# Patient Record
Sex: Female | Born: 1960 | Race: Black or African American | Hispanic: No | Marital: Single | Smoking: Former smoker
Health system: Southern US, Community
[De-identification: ages and names within clinical notes are randomized; demographics above are authoritative.]

## PROBLEM LIST (undated history)

## (undated) DIAGNOSIS — T7840XA Allergy, unspecified, initial encounter: Secondary | ICD-10-CM

## (undated) DIAGNOSIS — E119 Type 2 diabetes mellitus without complications: Secondary | ICD-10-CM

## (undated) DIAGNOSIS — M1990 Unspecified osteoarthritis, unspecified site: Secondary | ICD-10-CM

## (undated) DIAGNOSIS — I10 Essential (primary) hypertension: Secondary | ICD-10-CM

## (undated) HISTORY — DX: Allergy, unspecified, initial encounter: T78.40XA

## (undated) HISTORY — DX: Unspecified osteoarthritis, unspecified site: M19.90

## (undated) HISTORY — DX: Essential (primary) hypertension: I10

## (undated) HISTORY — DX: Type 2 diabetes mellitus without complications: E11.9

---

## 2020-06-26 HISTORY — PX: ROTATOR CUFF REPAIR: SHX139

## 2021-03-20 ENCOUNTER — Encounter: Payer: Self-pay | Admitting: Registered Nurse

## 2021-03-20 ENCOUNTER — Other Ambulatory Visit: Payer: Self-pay

## 2021-03-20 ENCOUNTER — Ambulatory Visit (INDEPENDENT_AMBULATORY_CARE_PROVIDER_SITE_OTHER): Payer: 59 | Admitting: Registered Nurse

## 2021-03-20 VITALS — BP 120/64 | HR 100 | Temp 97.3°F | Ht 65.0 in | Wt 156.8 lb

## 2021-03-20 DIAGNOSIS — I10 Essential (primary) hypertension: Secondary | ICD-10-CM

## 2021-03-20 DIAGNOSIS — Z1329 Encounter for screening for other suspected endocrine disorder: Secondary | ICD-10-CM | POA: Diagnosis not present

## 2021-03-20 DIAGNOSIS — Z13228 Encounter for screening for other metabolic disorders: Secondary | ICD-10-CM

## 2021-03-20 DIAGNOSIS — E119 Type 2 diabetes mellitus without complications: Secondary | ICD-10-CM | POA: Diagnosis not present

## 2021-03-20 DIAGNOSIS — Z130 Encounter for screening for diseases of the blood and blood-forming organs and certain disorders involving the immune mechanism: Secondary | ICD-10-CM

## 2021-03-20 DIAGNOSIS — J301 Allergic rhinitis due to pollen: Secondary | ICD-10-CM

## 2021-03-20 DIAGNOSIS — Z9889 Other specified postprocedural states: Secondary | ICD-10-CM

## 2021-03-20 DIAGNOSIS — K219 Gastro-esophageal reflux disease without esophagitis: Secondary | ICD-10-CM

## 2021-03-20 DIAGNOSIS — I498 Other specified cardiac arrhythmias: Secondary | ICD-10-CM

## 2021-03-20 MED ORDER — PANTOPRAZOLE SODIUM 40 MG PO TBEC: 40.0000 mg | DELAYED_RELEASE_TABLET | Freq: Every day | ORAL | 3 refills | Status: DC

## 2021-03-20 MED ORDER — AZELASTINE HCL 0.1 % NA SOLN: 1.0000 | Freq: Two times a day (BID) | NASAL | 12 refills | Status: DC

## 2021-03-20 MED ORDER — CETIRIZINE HCL 10 MG PO TABS: 10.0000 mg | ORAL_TABLET | Freq: Every day | ORAL | 11 refills | Status: DC

## 2021-03-20 NOTE — Progress Notes (Signed)
New Patient Office Visit  Subjective:  Patient ID: Charlotte Dickerson, female    DOB: 05-Apr-1961  Age: 60 y.o. MRN: [id number]  CC:  Chief Complaint  Patient presents with  . Other    New Patient    HPI Marianne Golightly presents for visit to est care  Moved to [place] from [place], [place] recently. Adjusting ok, likes area.   Concerns today: T2DM: Dx some time ago. No complications. Reports generally good control of a1c but over past few weeks sugars have climbed over 200 more frequently. No visual, neuro, or urinary symptoms. Wants to get all lab work checked today.  Allergies: Ongoing since moving to [place]. Has tried flonase with some effect. Open to further options Rhinitis and sinusitis. No wheezing or breathing difficulties. Some conjunctivitis but managed well with OTC drops  Fluttering: In chest. On and off. Not associated with exertion, anxiety, or pain. No chest pain. Not associated with reflux or any neuro symptoms. Has been on and off for some time. Had EKG at past PCP office in [place] which she reports was wnl. Has not seen cardiologist.  Shoulder pain: r shoulder, s/p rotator cuff repair in 2019. Was due for MRI w contrast in [place] but could not get this approved by insurance in time before moving. Would like to establish with local ortho.  Epigastric pain: Ongoing. Worse with first meal, resolves after. Occasional bubbling sensation. Burning pain. Not associated with fluttering. No bowel movement changes. Denies symptoms of anemia.   Histories reviewed and updated with patient.  Past Medical History:  Diagnosis Date  . Allergies   . Arthritis   . Diabetes (HCC)   . HTN (hypertension)     Past Surgical History:  Procedure Laterality Date  . ROTATOR CUFF REPAIR  06/2020    Family History  Problem Relation Age of Onset  . Diabetes Mother   . Heart attack Mother   . Hypertension Mother   . Stroke Mother   . Depression Sister     Social History    Socioeconomic History  . Marital status: Single    Spouse name: Not on file  . Number of children: Not on file  . Years of education: Not on file  . Highest education level: Not on file  Occupational History  . Not on file  Tobacco Use  . Smoking status: Former Smoker    Types: Cigarettes  . Smokeless tobacco: Never Used  Substance and Sexual Activity  . Alcohol use: Not Currently    Alcohol/week: 3.0 - 4.0 standard drinks    Types: 3 - 4 Glasses of wine per week  . Drug use: Never  . Sexual activity: Not Currently  Other Topics Concern  . Not on file  Social History Narrative  . Not on file   Social Determinants of Health   Financial Resource Strain: Not on file  Food Insecurity: Not on file  Transportation Needs: Not on file  Physical Activity: Not on file  Stress: Not on file  Social Connections: Not on file  Intimate Partner Violence: Not on file    ROS Review of Systems  Constitutional: Negative.   HENT: Positive for sinus pressure and sneezing.   Eyes: Positive for itching.  Respiratory: Negative.   Cardiovascular: Positive for palpitations. Negative for chest pain and leg swelling.  Gastrointestinal: Negative.   Genitourinary: Negative.   Musculoskeletal: Positive for arthralgias (shoulder pain).  Skin: Negative.   Neurological: Negative.   Psychiatric/Behavioral: Negative.   All  other systems reviewed and are negative.   Objective:   Today's Vitals: BP 120/64   Pulse 100   Temp (!) 97.3 F (36.3 C)   Ht 5\' 5"  (1.651 m)   Wt 156 lb 12.8 oz (71.1 kg)   SpO2 98%   BMI 26.09 kg/m   Physical Exam Vitals and nursing note reviewed.  Constitutional:      General: She is not in acute distress.    Appearance: Normal appearance. She is not ill-appearing, toxic-appearing or diaphoretic.  HENT:     Nose: Congestion and rhinorrhea present.     Mouth/Throat:     Mouth: Mucous membranes are moist.     Pharynx: Oropharynx is clear.  Cardiovascular:      Rate and Rhythm: Normal rate and regular rhythm.     Pulses: Normal pulses.     Heart sounds: Normal heart sounds. No murmur heard. No friction rub. No gallop.   Pulmonary:     Effort: Pulmonary effort is normal. No respiratory distress.     Breath sounds: Normal breath sounds. No stridor. No wheezing, rhonchi or rales.  Chest:     Chest wall: No tenderness.  Skin:    General: Skin is warm and dry.     Capillary Refill: Capillary refill takes less than 2 seconds.  Neurological:     General: No focal deficit present.     Mental Status: She is alert and oriented to person, place, and time. Mental status is at baseline.  Psychiatric:        Mood and Affect: Mood normal.        Behavior: Behavior normal.        Thought Content: Thought content normal.        Judgment: Judgment normal.     Assessment & Plan:   Problem List Items Addressed This Visit   None   Visit Diagnoses    Seasonal allergic rhinitis due to pollen    -  Primary   Relevant Medications   azelastine (ASTELIN) 0.1 % nasal spray   cetirizine (ZYRTEC) 10 MG tablet   Type 2 diabetes mellitus without complication, without long-term current use of insulin (HCC)       Relevant Medications   glipiZIDE (GLUCOTROL) 10 MG tablet   atorvastatin (LIPITOR) 20 MG tablet   metFORMIN (GLUCOPHAGE) 1000 MG tablet   Other Relevant Orders   CBC with Differential/Platelet   Comprehensive metabolic panel   Hemoglobin A1c   Lipid panel   TSH   Essential hypertension       Relevant Medications   atorvastatin (LIPITOR) 20 MG tablet   amLODipine (NORVASC) 10 MG tablet   Other Relevant Orders   CBC with Differential/Platelet   Comprehensive metabolic panel   Hemoglobin A1c   Lipid panel   TSH   Screening for endocrine, metabolic and immunity disorder       Relevant Orders   CBC with Differential/Platelet   Comprehensive metabolic panel   Hemoglobin A1c   Lipid panel   TSH      Outpatient Encounter Medications as  of 03/20/2021  Medication Sig  . amLODipine (NORVASC) 10 MG tablet Take 10 mg by mouth daily.  03/22/2021 azelastine (ASTELIN) 0.1 % nasal spray Place 1 spray into both nostrils 2 (two) times daily. Use in each nostril as directed  . cetirizine (ZYRTEC) 10 MG tablet Take 1 tablet (10 mg total) by mouth daily.  Marland Kitchen glipiZIDE (GLUCOTROL) 10 MG tablet Take 10 mg by mouth daily  before breakfast.  . metFORMIN (GLUCOPHAGE) 1000 MG tablet Take 1,000 mg by mouth 2 (two) times daily with a meal.  . atorvastatin (LIPITOR) 20 MG tablet Take 1 tablet by mouth at bedtime.   No facility-administered encounter medications on file as of 03/20/2021.    Follow-up: No follow-ups on file.   PLAN  Labs collected. Will follow up with the patient as warranted.  Return in 3 mo for med check and chronic conditions  Refill medications x 3 mo today  No concerns on limited exam  Try azelastine nasal spray and cetirizine for allergies.  Refer to cardiology  Refer to ortho  Pantoprazole for suspected GERD.   Patient encouraged to call clinic with any questions, comments, or concerns.  Janeece Agee, NP

## 2021-03-21 LAB — LIPID PANEL
Cholesterol: 166 mg/dL (ref 0–200)
HDL: 36.5 mg/dL — ABNORMAL LOW (ref >39.00)
NonHDL: 129.35
Total CHOL/HDL Ratio: 5
Triglycerides: 204 mg/dL — ABNORMAL HIGH (ref 0.0–149.0)
VLDL: 40.8 mg/dL — ABNORMAL HIGH (ref 0.0–40.0)

## 2021-03-21 LAB — COMPREHENSIVE METABOLIC PANEL
ALT: 25 U/L (ref 0–35)
AST: 22 U/L (ref 0–37)
Albumin: 4.7 g/dL (ref 3.5–5.2)
Alkaline Phosphatase: 101 U/L (ref 39–117)
BUN: 16 mg/dL (ref 6–23)
CO2: 28 mEq/L (ref 19–32)
Calcium: 10.8 mg/dL — ABNORMAL HIGH (ref 8.4–10.5)
Chloride: 103 mEq/L (ref 96–112)
Creatinine, Ser: 0.83 mg/dL (ref 0.40–1.20)
GFR: 77.56 mL/min (ref >60.00)
Glucose, Bld: 150 mg/dL — ABNORMAL HIGH (ref 70–99)
Potassium: 4.6 mEq/L (ref 3.5–5.1)
Sodium: 140 mEq/L (ref 135–145)
Total Bilirubin: 0.7 mg/dL (ref 0.2–1.2)
Total Protein: 7.8 g/dL (ref 6.0–8.3)

## 2021-03-21 LAB — CBC WITH DIFFERENTIAL/PLATELET
Basophils Absolute: 0.1 10*3/uL (ref 0.0–0.1)
Basophils Relative: 1.2 % (ref 0.0–3.0)
Eosinophils Absolute: 0.1 10*3/uL (ref 0.0–0.7)
Eosinophils Relative: 1.5 % (ref 0.0–5.0)
HCT: 40 % (ref 36.0–46.0)
Hemoglobin: 13.4 g/dL (ref 12.0–15.0)
Lymphocytes Relative: 38.6 % (ref 12.0–46.0)
Lymphs Abs: 3.2 10*3/uL (ref 0.7–4.0)
MCHC: 33.4 g/dL (ref 30.0–36.0)
MCV: 78.2 fl (ref 78.0–100.0)
Monocytes Absolute: 0.4 10*3/uL (ref 0.1–1.0)
Monocytes Relative: 5.1 % (ref 3.0–12.0)
Neutro Abs: 4.4 10*3/uL (ref 1.4–7.7)
Neutrophils Relative %: 53.6 % (ref 43.0–77.0)
Platelets: 334 10*3/uL (ref 150.0–400.0)
RBC: 5.11 Mil/uL (ref 3.87–5.11)
RDW: 14.5 % (ref 11.5–15.5)
WBC: 8.2 10*3/uL (ref 4.0–10.5)

## 2021-03-21 LAB — LDL CHOLESTEROL, DIRECT: Direct LDL: 109 mg/dL

## 2021-03-21 LAB — HEMOGLOBIN A1C: Hgb A1c MFr Bld: 9.1 % — ABNORMAL HIGH (ref 4.6–6.5)

## 2021-03-21 LAB — TSH: TSH: 0.78 u[IU]/mL (ref 0.35–4.50)

## 2021-03-22 ENCOUNTER — Encounter: Payer: Self-pay | Admitting: Registered Nurse

## 2021-03-27 ENCOUNTER — Telehealth: Payer: Self-pay

## 2021-03-27 NOTE — Telephone Encounter (Signed)
Patient is calling in wondering if someone is able to go over her test results with her.

## 2021-03-31 ENCOUNTER — Encounter: Payer: Self-pay | Admitting: Orthopaedic Surgery

## 2021-03-31 ENCOUNTER — Ambulatory Visit (INDEPENDENT_AMBULATORY_CARE_PROVIDER_SITE_OTHER): Payer: 59 | Admitting: Orthopaedic Surgery

## 2021-03-31 ENCOUNTER — Ambulatory Visit (INDEPENDENT_AMBULATORY_CARE_PROVIDER_SITE_OTHER): Payer: 59

## 2021-03-31 VITALS — Ht 65.0 in | Wt 158.0 lb

## 2021-03-31 DIAGNOSIS — G8929 Other chronic pain: Secondary | ICD-10-CM | POA: Diagnosis not present

## 2021-03-31 DIAGNOSIS — M25511 Pain in right shoulder: Secondary | ICD-10-CM

## 2021-03-31 NOTE — Progress Notes (Signed)
Office Visit Note   Patient: Charlotte Dickerson           Date of Birth: 08/13/1961           MRN: [id number] Visit Date: 03/31/2021              Requested by: Janeece Agee, NP [address] [place],  [place] [address] PCP: Janeece Agee, NP   Assessment & Plan: Visit Diagnoses:  1. Chronic right shoulder pain     Plan: My impression is postsurgical adhesive capsulitis.  Her most recent A1c was also 9.1.  She understands the importance of how poorly controlled diabetes affects the outcome and function of her shoulder.  I have made a referral to outpatient physical therapy as well as a referral to Dr. Prince Rome for a glenohumeral injection.  I would like to recheck her in about 8 weeks.  Follow-Up Instructions: Return in about 8 weeks (around 05/26/2021).   Orders:  Orders Placed This Encounter  Procedures  . XR Shoulder Right  . Ambulatory referral to Physical Therapy   No orders of the defined types were placed in this encounter.     Procedures: No procedures performed   Clinical Data: No additional findings.   Subjective: Chief Complaint  Patient presents with  . Right Shoulder - Pain    Lilac is a 60 year old female who recently relocated to [place] from [place].  She works as a Water engineer.  She is right-hand dominant.  She had a rotator cuff repair in August of last year.  She states that she continues to have pain and decreased range of motion.  She is a poorly controlled diabetic.  She completed physical therapy down in [place] but has been unable to resume this since she has moved to [place].  Currently not taking any pain medications.  She was also supposed to get another MRI due to continued pain but because of the move she never underwent the study.  She has trouble reaching back as well as above her head.   Review of Systems  Constitutional: Negative.   HENT: Negative.   Eyes: Negative.   Respiratory: Negative.   Cardiovascular: Negative.    Endocrine: Negative.   Musculoskeletal: Negative.   Neurological: Negative.   Hematological: Negative.   Psychiatric/Behavioral: Negative.   All other systems reviewed and are negative.    Objective: Vital Signs: Ht 5\' 5"  (1.651 m)   Wt 158 lb (71.7 kg)   BMI 26.29 kg/m   Physical Exam Vitals and nursing note reviewed.  Constitutional:      Appearance: She is well-developed.  HENT:     Head: Normocephalic and atraumatic.  Pulmonary:     Effort: Pulmonary effort is normal.  Abdominal:     Palpations: Abdomen is soft.  Musculoskeletal:     Cervical back: Neck supple.  Skin:    General: Skin is warm.     Capillary Refill: Capillary refill takes less than 2 seconds.  Neurological:     Mental Status: She is alert and oriented to person, place, and time.  Psychiatric:        Behavior: Behavior normal.        Thought Content: Thought content normal.        Judgment: Judgment normal.     Ortho Exam Right shoulder shows fully healed surgical scars.  Contours are symmetric to the contralateral side.  Range of motion is consistent with moderate adhesive capsulitis.  Strength is slightly decreased  secondary to pain. Specialty Comments:  No specialty comments available.  Imaging: XR Shoulder Right  Result Date: 03/31/2021 Arthritic changes to the [facility] joint.  Circular lucencies within the humeral head likely from recent rotator cuff anchor placement    PMFS History: There are no problems to display for this patient.  Past Medical History:  Diagnosis Date  . Allergies   . Arthritis   . Diabetes (HCC)   . HTN (hypertension)     Family History  Problem Relation Age of Onset  . Diabetes Mother   . Heart attack Mother   . Hypertension Mother   . Stroke Mother   . Depression Sister     Past Surgical History:  Procedure Laterality Date  . ROTATOR CUFF REPAIR  06/2020   Social History   Occupational History  . Not on file  Tobacco Use  . Smoking status: Former  Smoker    Types: Cigarettes  . Smokeless tobacco: Never Used  Substance and Sexual Activity  . Alcohol use: Not Currently    Alcohol/week: 3.0 - 4.0 standard drinks    Types: 3 - 4 Glasses of wine per week  . Drug use: Never  . Sexual activity: Not Currently

## 2021-04-03 ENCOUNTER — Telehealth: Payer: Self-pay | Admitting: Registered Nurse

## 2021-04-03 NOTE — Telephone Encounter (Signed)
..  Medication Refills  Last OV:  Medication:  Patient needs all 4 of her medications Pharmacy:  [facility] [place]  Let patient know to contact pharmacy at the end of the day to make sure medication is ready.   Please notify patient to allow 48-72 hours to process.  Encourage patient to contact the pharmacy for refills or they can request refills through [facility]  Clinical Fills out below:   Last refill:  QTY:  Refill Date:    Other Comments:   Okay for refill?  Please advise.

## 2021-04-06 ENCOUNTER — Telehealth: Payer: Self-pay | Admitting: Orthopaedic Surgery

## 2021-04-06 NOTE — Telephone Encounter (Signed)
Referral to Our PT upstairs  Number was given

## 2021-04-06 NOTE — Telephone Encounter (Signed)
Pt called asking for the number of the facility her PT referral was sent to; please.   [phone] ok to LVM if she doesn't answer.

## 2021-04-06 NOTE — Telephone Encounter (Signed)
Patient called back very unhappy that she has not had a response on this message - Please advise

## 2021-04-07 ENCOUNTER — Other Ambulatory Visit: Payer: Self-pay

## 2021-04-07 ENCOUNTER — Other Ambulatory Visit: Payer: Self-pay | Admitting: Registered Nurse

## 2021-04-07 ENCOUNTER — Ambulatory Visit: Payer: 59 | Admitting: Family Medicine

## 2021-04-07 DIAGNOSIS — I10 Essential (primary) hypertension: Secondary | ICD-10-CM

## 2021-04-07 DIAGNOSIS — E119 Type 2 diabetes mellitus without complications: Secondary | ICD-10-CM

## 2021-04-07 MED ORDER — AMLODIPINE BESYLATE 10 MG PO TABS: 10.0000 mg | ORAL_TABLET | Freq: Every day | ORAL | 1 refills | Status: DC

## 2021-04-07 MED ORDER — GLIPIZIDE 10 MG PO TABS: 10.0000 mg | ORAL_TABLET | Freq: Every day | ORAL | 1 refills | Status: DC

## 2021-04-07 MED ORDER — METFORMIN HCL 1000 MG PO TABS: 1000.0000 mg | ORAL_TABLET | Freq: Two times a day (BID) | ORAL | 1 refills | Status: DC

## 2021-04-07 MED ORDER — ATORVASTATIN CALCIUM 20 MG PO TABS: 1.0000 | ORAL_TABLET | Freq: Every day | ORAL | 1 refills | Status: DC

## 2021-04-07 MED ORDER — TRUE METRIX PRO BLOOD GLUCOSE VI STRP
ORAL_STRIP | 12 refills | Status: DC
Start: 2021-04-07 — End: 2021-04-07

## 2021-04-07 MED ORDER — TRUE METRIX PRO BLOOD GLUCOSE VI STRP: ORAL_STRIP | 12 refills | Status: DC

## 2021-04-07 NOTE — Telephone Encounter (Signed)
Test strips sent to pharmacy.

## 2021-04-07 NOTE — Telephone Encounter (Signed)
Sent ? ?Thanks, ? ?Rich

## 2021-04-07 NOTE — Telephone Encounter (Signed)
Letter has been sent. 3 month follow up suggested given A1c of 9.1. Otherwise no acute concerns.   Thank you  Rich

## 2021-04-07 NOTE — Telephone Encounter (Signed)
Called patient with pcp information. Patient states she never received a letter. Address verified. Letter was read to patient. She voiced understanding. Also informed her that her prescriptions were sent to her pharmacy.

## 2021-04-21 ENCOUNTER — Other Ambulatory Visit: Payer: Self-pay

## 2021-04-21 ENCOUNTER — Ambulatory Visit: Payer: 59 | Attending: Orthopaedic Surgery

## 2021-04-21 DIAGNOSIS — G8929 Other chronic pain: Secondary | ICD-10-CM | POA: Diagnosis present

## 2021-04-21 DIAGNOSIS — M25511 Pain in right shoulder: Secondary | ICD-10-CM | POA: Diagnosis present

## 2021-04-21 DIAGNOSIS — R293 Abnormal posture: Secondary | ICD-10-CM | POA: Diagnosis present

## 2021-04-21 DIAGNOSIS — M25611 Stiffness of right shoulder, not elsewhere classified: Secondary | ICD-10-CM | POA: Insufficient documentation

## 2021-04-21 DIAGNOSIS — M6281 Muscle weakness (generalized): Secondary | ICD-10-CM | POA: Insufficient documentation

## 2021-04-21 NOTE — Therapy (Signed)
[facility] [address] [place], [place], [address] Phone: [phone]   Fax:  [phone]  Physical Therapy Evaluation  Patient Details  Name: Charlotte Dickerson MRN: [id number] Date of Birth: August 16, 1961 Referring Provider (PT): Naiping M. Roda Shutters, MD   Encounter Date: 04/21/2021   PT End of Session - 04/21/21 1407    Visit Number 1    Number of Visits 16    Date for PT Re-Evaluation 06/30/21    Authorization Type [employer]    PT Start Time 1104    PT Stop Time 1155    PT Time Calculation (min) 51 min    Activity Tolerance Patient tolerated treatment well;Patient limited by pain    Behavior During Therapy [facility] for tasks assessed/performed           Past Medical History:  Diagnosis Date  . Allergies   . Arthritis   . Diabetes (HCC)   . HTN (hypertension)     Past Surgical History:  Procedure Laterality Date  . ROTATOR CUFF REPAIR  06/2020    There were no vitals filed for this visit.    Subjective Assessment - 04/21/21 1112    Subjective Pt reports she had a R RCR in [place]. She had ~30 PT session in [place]. She feels pain constantly, but is most restrictive in motion, feeling pulling in the back of her shoulder and scapula with some numbness in R UE. She is scared of a cortisone injection and of anything bad happening. Her shoulder started freezing in Jan/Feb.    Limitations House hold activities;Lifting    Diagnostic tests R shoulder XR: Arthritic changes to the [employer] joint.  Circular lucencies within the humeral   head likely from recent rotator cuff anchor placement    Patient Stated Goals I want to be able to use my arm and stretch it out    Currently in Pain? Yes    Pain Score 5    Worst 7-8/10   Pain Location Shoulder    Pain Orientation Right;Anterior;Posterior    Pain Descriptors / Personal assistant;Aching;Tightness    Pain Radiating Towards down the arm with some numbness    Pain Onset More than  a month ago    Pain Frequency Intermittent    Aggravating Factors  moving it, sleeping    Pain Relieving Factors Intermittently ice/heat, stretching    Effect of Pain on Daily Activities can't wash back, difficulty reaching behind head to wash hair              [facility] PT Assessment - 04/21/21 0001      Assessment   Referring Provider (PT) Naiping M. Roda Shutters, MD    Onset Date/Surgical Date 07/18/20    Hand Dominance Right    Next MD Visit 3-6 weeks    Prior Therapy Yes for shoulder following surgery      Precautions   Precautions None      Restrictions   Weight Bearing Restrictions No      Balance Screen   Has the patient fallen in the past 6 months No    Has the patient had a decrease in activity level because of a fear of falling?  Yes    Is the patient reluctant to leave their home because of a fear of falling?  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, trying to open a food truck    Leisure Cooking, church, resting      Observation/Other Assessments   Focus on Therapeutic Outcomes (FOTO)  51% ability, 66% predicted      Posture/Postural Control   Posture/Postural Control Postural limitations    Posture Comments Mild forward rounded shoulders      ROM / Strength   AROM / PROM / Strength AROM;Strength      AROM   Overall AROM Comments pain in R    AROM Assessment Site Shoulder    Right/Left Shoulder Right;Left    Right Shoulder Flexion 95 Degrees    Right Shoulder ABduction 80 Degrees    Right Shoulder Internal Rotation --   functional to glute   Right Shoulder External Rotation --   functional to occiput   Left Shoulder Flexion 145 Degrees    Left  Shoulder ABduction 158 Degrees    Left Shoulder Internal Rotation --   functional to T7   Left Shoulder External Rotation --   functional to T3     Strength   Overall Strength Comments pain in R shoulder    Strength Assessment Site Shoulder    Right/Left Shoulder Right;Left    Right Shoulder Flexion 4-/5    Right Shoulder ABduction 4-/5    Right Shoulder Internal Rotation 4/5    Right Shoulder External Rotation 4-/5    Left Shoulder Flexion 5/5    Left Shoulder ABduction 5/5    Left Shoulder Internal Rotation 5/5    Left Shoulder External Rotation 4+/5      Palpation   Palpation comment increased soft tissue tension to R UT, LS, pecs; TTP anterior ACJ and periscapular                      Objective measurements completed on examination: See above findings.               PT Education - 04/21/21 1409    Education Details educated on FOTO, diagnosis, prognosis, HEP, and POC    Person(s) Educated Patient    Methods Explanation;Demonstration;Tactile cues;Verbal cues;Handout    Comprehension Verbalized understanding;Returned demonstration;Verbal cues required;Tactile cues required            PT Short Term Goals - 04/21/21 1418      PT SHORT TERM GOAL #1   Title Pt will be I and compliant with initial HEP.    Baseline provided at eval    Time 3    Period Weeks    Status New    Target Date 05/12/21      PT SHORT TERM GOAL #2   Title Pt will demonstrate functional IR to at least L1 and ER to at least C7, in order to perform hygiene on low back and hair in the shower.    Baseline glute and occiput    Time 4    Period Weeks    Status New    Target Date 05/19/21             PT Long Term Goals - 04/21/21 1419      PT LONG TERM GOAL #1   Title Pt will demonstrate functional IR to at least T8 and ER  to at least T1, in order to perform hygiene, grooming, don/doff bra and other clothing.    Baseline currently ER to glute and IR to occiput    Time 8     Period Weeks    Status New    Target Date 06/16/21      PT LONG TERM GOAL #2   Title Pt will increase flexion and abduction ROM to 145 or greater for symmetry with L UE    Baseline see flowsheet    Time 8    Period Weeks    Status New    Target Date 06/16/21      PT LONG TERM GOAL #3   Title Pt will be able to sleep through the night without waking up secondary to R shoulder pain.    Time 8    Period Weeks    Status New    Target Date 06/16/21      PT LONG TERM GOAL #4   Title Pt will decrease R shoulder pain to </= 3/10 with all functional activity, including I/ADLs and work.    Baseline 8/10 at worst    Time 8    Period Weeks    Status New    Target Date 06/16/21      PT LONG TERM GOAL #5   Title Pt will increase FOTO ability to at least 66% ability, in order to demonstrate meaningful change in perceived level of functional ability.    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 Pt did have physical therapy for ~30 session after surgery when she was living in [place]. She moved to [place] earlier this year and experienced onset of frozen shoulder soonafter. Pt reports her referring provider suggested cortisone injection, which she was scared of but will consider at her follow-up visit. She demonstrates impairments in R shoulder ROM, MMT, posture and positioning of R shoulder, overuse of R UT/LS, dysfunctional scapulohumeral rhythm, and pain with all motion. 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 She was educated on FOTO, diagnosis, prognosis, HEP, and POC. Pt verbalized understanding and consent to tx and would benefit from skilled PT 2x/week for 4 weeks then 1-2x/week for 4 more weeks to address impairments for  return to PLOF.    Personal Factors and Comorbidities Profession;Time since onset of injury/illness/exacerbation;Comorbidity 3+    Comorbidities Arthritis, DM II, HTN    Examination-Activity Limitations Bathing;Lift;Hygiene/Grooming;Dressing;Sleep;Carry;Caring for Others;Reach Overhead    Examination-Participation Restrictions Cleaning;Meal Prep;Laundry;Community Activity    Stability/Clinical Decision Making Evolving/Moderate complexity    Clinical Decision Making Moderate    Rehab Potential Good    PT Frequency --   1-2x/week   PT Duration 8 weeks    PT Treatment/Interventions ADLs/Self Care Home Management;Cryotherapy;Electrical Stimulation;Iontophoresis 4mg /ml Dexamethasone;Moist Heat;Functional mobility training;Therapeutic activities;Therapeutic exercise;Neuromuscular re-education;Patient/family education;Manual techniques;Passive range of motion;Dry needling;Taping;Vasopneumatic Device;Spinal Manipulations;Joint Manipulations    PT Next Visit Plan Assess response to HEP/update PRN, manual therapy to address joint and soft tissue restrictions, increase R shoulder ROM, ice/heat as needed    PT Home Exercise Plan [email or web address]    Consulted and Agree with  Plan of Care Patient           Patient will benefit from skilled therapeutic intervention in order to improve the following deficits and impairments:  Hypomobility,Decreased activity tolerance,Decreased range of motion,Decreased strength,Increased fascial restricitons,Impaired flexibility,Postural dysfunction,Improper body mechanics,Impaired UE functional use,Impaired perceived functional ability,Pain  Visit Diagnosis: Chronic right shoulder pain  Stiffness of right shoulder, not elsewhere classified  Abnormal posture  Muscle weakness (generalized)     Problem List There are no problems to display for this patient.   Marcelline Mates, PT, DPT 04/21/2021, 2:30 PM  [facility] [address] [place], [place], [address] Phone: [phone]   Fax:  [phone]  Name: Charlotte Dickerson MRN: [id number] Date of Birth: [birth year]

## 2021-04-21 NOTE — Patient Instructions (Signed)
Access Code: [email or web address] URL: [email or web address] Date: 04/21/2021 Prepared by: Gardiner Rhyme  Exercises Standing Single Arm Shoulder Flexion Stretch on Wall - 2 x daily - 7 x weekly - 1 sets - 10 reps - 3 seconds hold Doorway Pec Stretch at 60 Degrees Abduction with Arm Straight - 1 x daily - 7 x weekly - 3 sets - 20-30 seconds hold Single Arm Doorway Pec Stretch at 60 Elevation - 1 x daily - 7 x weekly - 3 sets - 20-30 seconds hold Corner Pec Minor Stretch - 2 x daily - 7 x weekly - 3 sets - 20-30 seconds hold

## 2021-05-01 ENCOUNTER — Ambulatory Visit: Payer: 59 | Admitting: Physical Therapy

## 2021-05-05 ENCOUNTER — Ambulatory Visit: Payer: 59 | Admitting: Rehabilitative and Restorative Service Providers"

## 2021-05-08 ENCOUNTER — Ambulatory Visit: Payer: 59 | Admitting: Physical Therapy

## 2021-05-15 ENCOUNTER — Ambulatory Visit: Payer: 59 | Admitting: Physical Therapy

## 2021-05-19 ENCOUNTER — Ambulatory Visit: Payer: 59 | Attending: Orthopaedic Surgery | Admitting: Physical Therapy

## 2021-05-19 ENCOUNTER — Encounter: Payer: Self-pay | Admitting: Physical Therapy

## 2021-05-19 ENCOUNTER — Other Ambulatory Visit: Payer: Self-pay

## 2021-05-19 DIAGNOSIS — M25611 Stiffness of right shoulder, not elsewhere classified: Secondary | ICD-10-CM | POA: Insufficient documentation

## 2021-05-19 DIAGNOSIS — M6281 Muscle weakness (generalized): Secondary | ICD-10-CM | POA: Diagnosis present

## 2021-05-19 DIAGNOSIS — R293 Abnormal posture: Secondary | ICD-10-CM | POA: Diagnosis present

## 2021-05-19 DIAGNOSIS — M25511 Pain in right shoulder: Secondary | ICD-10-CM | POA: Insufficient documentation

## 2021-05-19 DIAGNOSIS — G8929 Other chronic pain: Secondary | ICD-10-CM | POA: Insufficient documentation

## 2021-05-19 NOTE — Therapy (Signed)
[place], [name], [address] Phone: [phone]   Fax:  [phone]  Physical Therapy Treatment  Patient Details  Name: Charlotte Dickerson MRN: [id number] Date of Birth: 1960/12/23 Referring Provider (PT): Naiping M. Erlinda Hong, MD   Encounter Date: 05/19/2021   PT End of Session - 05/19/21 1210     Visit Number 2    Number of Visits 16    Date for PT Re-Evaluation 06/30/21    Authorization Type [employer]    PT Start Time 1100    PT Stop Time 1155    PT Time Calculation (min) 55 min    Activity Tolerance Patient tolerated treatment well;Patient limited by pain    Behavior During Therapy [facility] for tasks assessed/performed             Past Medical History:  Diagnosis Date   Allergies    Arthritis    Diabetes ([place])    HTN (hypertension)     Past Surgical History:  Procedure Laterality Date   ROTATOR CUFF REPAIR  06/2020    There were no vitals filed for this visit.   Subjective Assessment - 05/19/21 1104     Subjective i am not really in pain, just stiff. I am not doing the exercises as much as I should.    Currently in Pain? No/denies                [facility] PT Assessment - 05/19/21 0001       AROM   Right Shoulder Flexion 106 Degrees    Right Shoulder ABduction 99 Degrees    Right Shoulder Internal Rotation --   functional to glute   Right Shoulder External Rotation --   back of neck                          [facility] Adult PT Treatment/Exercise - 05/19/21 0001       Shoulder Exercises: Standing   Other Standing Exercises standing cane shoulder extension and IR      Shoulder Exercises: Pulleys   Flexion 2 minutes    Scaption 1 minute      Shoulder Exercises: ROM/Strengthening   UBE (Upper Arm Bike) L1 2 minutes forward and back    Other ROM/Strengthening Exercises standing cane shoulder extension and IR x 10  each    Other ROM/Strengthening Exercises shoulder  flexion wall slide x 10, shoulder abduction wall slide x 10      Shoulder Exercises: Stretch   Corner Stretch Limitations doorway stretches per HEP- 3 ways      Manual Therapy   Manual Therapy Joint mobilization;Passive ROM    Joint Mobilization A/P and inferior GHJ mobs    Passive ROM 4 way right shoulder to tolerance                      PT Short Term Goals - 05/19/21 1207       PT SHORT TERM GOAL #1   Title Pt will be I and compliant with initial HEP.    Baseline needs cues, limited compliance thus far    Time 3    Period Weeks    Status On-going      PT SHORT TERM GOAL #2   Title Pt will demonstrate functional IR to at least L1 and ER to at least C7, in order to perform hygiene on low back and hair in the shower.  Baseline glute and T2    Time 4    Period Weeks               PT Long Term Goals - 04/21/21 1419       PT LONG TERM GOAL #1   Title Pt will demonstrate functional IR to at least T8 and ER to at least T1, in order to perform hygiene, grooming, don/doff bra and other clothing.    Baseline currently ER to glute and IR to occiput    Time 8    Period Weeks    Status New    Target Date 06/16/21      PT LONG TERM GOAL #2   Title Pt will increase flexion and abduction ROM to 145 or greater for symmetry with L UE    Baseline see flowsheet    Time 8    Period Weeks    Status New    Target Date 06/16/21      PT LONG TERM GOAL #3   Title Pt will be able to sleep through the night without waking up secondary to R shoulder pain.    Time 8    Period Weeks    Status New    Target Date 06/16/21      PT LONG TERM GOAL #4   Title Pt will decrease R shoulder pain to </= 3/10 with all functional activity, including I/ADLs and work.    Baseline 8/10 at worst    Time 8    Period Weeks    Status New    Target Date 06/16/21      PT LONG TERM GOAL #5   Title Pt will increase FOTO ability to at least 66% ability, in order to demonstrate  meaningful change in perceived level of functional ability.    Baseline 51%    Time 8    Period Weeks    Status New    Target Date 06/16/21                   Plan - 05/19/21 1205     Clinical Impression Statement Pt arrives after 4 weeks and reports limited compliance with HEP. She demonstrates improved AROM of right shoulder. STG# 2 met.  Continued with HEP and progression of AAROM, PROM. She had pain with end range stretching. HMP applied at end of session to decrease soreness.    PT Next Visit Plan Assess response to HEP/update PRN, manual therapy to address joint and soft tissue restrictions, increase R shoulder ROM, ice/heat as needed    PT Home Exercise Plan [id number]             Patient will benefit from skilled therapeutic intervention in order to improve the following deficits and impairments:  Hypomobility, Decreased activity tolerance, Decreased range of motion, Decreased strength, Increased fascial restricitons, Impaired flexibility, Postural dysfunction, Improper body mechanics, Impaired UE functional use, Impaired perceived functional ability, Pain  Visit Diagnosis: Chronic right shoulder pain  Stiffness of right shoulder, not elsewhere classified  Abnormal posture  Muscle weakness (generalized)     Problem List There are no problems to display for this patient.   Dorene Ar, [place] 05/19/2021, 12:14 PM  [place], [name], [address] Phone: [phone]   Fax:  [phone]  Name: Charlotte Dickerson MRN: [id number] Date of Birth: 1961/01/12

## 2021-05-22 ENCOUNTER — Ambulatory Visit: Payer: 59 | Admitting: Interventional Cardiology

## 2021-05-22 ENCOUNTER — Encounter: Payer: Self-pay | Admitting: Physical Therapy

## 2021-05-22 ENCOUNTER — Ambulatory Visit: Payer: 59 | Admitting: Physical Therapy

## 2021-05-22 ENCOUNTER — Other Ambulatory Visit: Payer: Self-pay

## 2021-05-22 ENCOUNTER — Encounter: Payer: Self-pay | Admitting: Interventional Cardiology

## 2021-05-22 VITALS — BP 110/70 | HR 78 | Ht 65.0 in | Wt 156.0 lb

## 2021-05-22 DIAGNOSIS — E118 Type 2 diabetes mellitus with unspecified complications: Secondary | ICD-10-CM | POA: Diagnosis not present

## 2021-05-22 DIAGNOSIS — R072 Precordial pain: Secondary | ICD-10-CM

## 2021-05-22 DIAGNOSIS — M25511 Pain in right shoulder: Secondary | ICD-10-CM

## 2021-05-22 DIAGNOSIS — R002 Palpitations: Secondary | ICD-10-CM | POA: Diagnosis not present

## 2021-05-22 DIAGNOSIS — M25611 Stiffness of right shoulder, not elsewhere classified: Secondary | ICD-10-CM

## 2021-05-22 DIAGNOSIS — R293 Abnormal posture: Secondary | ICD-10-CM

## 2021-05-22 DIAGNOSIS — E782 Mixed hyperlipidemia: Secondary | ICD-10-CM | POA: Diagnosis not present

## 2021-05-22 DIAGNOSIS — M6281 Muscle weakness (generalized): Secondary | ICD-10-CM

## 2021-05-22 MED ORDER — METOPROLOL TARTRATE 100 MG PO TABS: ORAL_TABLET | ORAL | 0 refills | Status: DC

## 2021-05-22 NOTE — Progress Notes (Signed)
Cardiology Office Note   Date:  05/22/2021   ID:  Charlotte Dickerson, DOB Jun 30, 1961, MRN [id number]  PCP:  Janeece Agee, NP    No chief complaint on file.  palpitations  Wt Readings from Last 3 Encounters:  05/22/21 156 lb (70.8 kg)  03/31/21 158 lb (71.7 kg)  03/20/21 156 lb 12.8 oz (71.1 kg)       History of Present Illness: Charlotte Dickerson is a 60 y.o. female who is being seen today for the evaluation of palpitations, chest pain at the request of Janeece Agee, NP.   She has been having some left chest pain and back pain.  Palpitations can last 1-2 minutes at a time.  Sx occur 2-3 x /week.    No early CAD in the family.  DM for 12 years.    Denies : Dizziness. Leg edema. Nitroglycerin use. Orthopnea. Paroxysmal nocturnal dyspnea. Shortness of breath. Syncope.     Past Medical History:  Diagnosis Date   Allergies    Arthritis    Diabetes ([facility])    HTN (hypertension)     Past Surgical History:  Procedure Laterality Date   ROTATOR CUFF REPAIR  06/2020     Current Outpatient Medications  Medication Sig Dispense Refill   amLODipine (NORVASC) 10 MG tablet Take 1 tablet (10 mg total) by mouth daily. 90 tablet 1   atorvastatin (LIPITOR) 20 MG tablet Take 1 tablet (20 mg total) by mouth at bedtime. 90 tablet 1   glipiZIDE (GLUCOTROL) 10 MG tablet Take 1 tablet (10 mg total) by mouth daily before breakfast. 90 tablet 1   glucose blood (TRUE METRIX PRO BLOOD GLUCOSE) test strip Use as instructed 1- 3 times daily to monitor blood glucose. 100 each 12   metFORMIN (GLUCOPHAGE) 1000 MG tablet Take 1 tablet (1,000 mg total) by mouth 2 (two) times daily with a meal. 180 tablet 1   No current facility-administered medications for this visit.    Allergies:   Patient has no known allergies.    Social History:  The patient  reports that she has quit smoking. Her smoking use included cigarettes. She has never used smokeless tobacco. She reports previous alcohol use of about 3.0 -  4.0 standard drinks of alcohol per week. She reports that she does not use drugs.   Family History:  The patient's family history includes Depression in her sister; Diabetes in her mother; Heart attack in her mother; Hypertension in her mother; Stroke in her mother.    ROS:  Please see the history of present illness.   Otherwise, review of systems are positive for chest pain- feels like pressure, no arm pain or diaphoresis.   All other systems are reviewed and negative.    PHYSICAL EXAM: VS:  BP 110/70   Pulse 78   Ht 5\' 5"  (1.651 m)   Wt 156 lb (70.8 kg)   SpO2 96%   BMI 25.96 kg/m  , BMI Body mass index is 25.96 kg/m. GEN: Well nourished, well developed, in no acute distress HEENT: normal Neck: no JVD, carotid bruits, or masses Cardiac: RRR; no murmurs, rubs, or gallops,no edema  Respiratory:  clear to auscultation bilaterally, normal work of breathing GI: soft, nontender, nondistended, + BS MS: no deformity or atrophy; 2+ PT pulses; small mole on vein on right shin which is painful to touch- not growing per her report Skin: warm and dry, no rash Neuro:  Strength and sensation are intact Psych: euthymic mood, full affect  EKG:   The ekg ordered today demonstrates NSR, no ST changes   Recent Labs: 03/20/2021: ALT 25; BUN 16; Creatinine, Ser 0.83; Hemoglobin 13.4; Platelets 334.0; Potassium 4.6; Sodium 140; TSH 0.78   Lipid Panel    Component Value Date/Time   CHOL 166 03/20/2021 1611   TRIG 204.0 (H) 03/20/2021 1611   HDL 36.50 (L) 03/20/2021 1611   CHOLHDL 5 03/20/2021 1611   VLDL 40.8 (H) 03/20/2021 1611   LDLDIRECT 109.0 03/20/2021 1611     Other studies Reviewed: Additional studies/ records that were reviewed today with results demonstrating: Labs reviewed.   ASSESSMENT AND PLAN:  Precordial chest pain: Some atypical features but she has a long history of diabetes.  Metoprolol 100 mg to help slow her heart rate down appropriately for CT scan. Diabetes:  High-fiber diet.  A1c was 9.1 in April.  She thinks this will be better now that she is improved her diet.  Increase exercise. Palpitations: No syncope.  Brief episodes.  Plan for 2-week Zio patch to further evaluate.  Arrhythmia does not seem high risk. Hyperlipidemia: COntinue atorvastatin. TG elevated 204.  TC 166.    Current medicines are reviewed at length with the patient today.  The patient concerns regarding her medicines were addressed.  The following changes have been made:  No change  Labs/ tests ordered today include:  No orders of the defined types were placed in this encounter.   Recommend 150 minutes/week of aerobic exercise Low fat, low carb, high fiber diet recommended  Disposition:   FU for CTA and Zio   Signed, Lance Muss, MD  05/22/2021 4:54 PM    [facility] [address] [place], [place], [place]  [address] Phone: [phone]; Fax: [phone]

## 2021-05-22 NOTE — Patient Instructions (Signed)
Medication Instructions:  Your physician recommends that you continue on your current medications as directed. Please refer to the Current Medication list given to you today.  *If you need a refill on your cardiac medications before your next appointment, please call your pharmacy*   Lab Work: Your physician recommends that you return for lab work prior to CT Scan.  BMP If you have labs (blood work) drawn today and your tests are completely normal, you will receive your results only by: MyChart Message (if you have MyChart) OR A paper copy in the mail If you have any lab test that is abnormal or we need to change your treatment, we will call you to review the results.   Testing/Procedures: Your physician has recommended that you wear a holter monitor. Holter monitors are medical devices that record the heart's electrical activity. Doctors most often use these monitors to diagnose arrhythmias. Arrhythmias are problems with the speed or rhythm of the heartbeat. The monitor is a small, portable device. You can wear one while you do your normal daily activities. This is usually used to diagnose what is causing palpitations/syncope (passing out).   Your physician has requested that you have cardiac CT. Cardiac computed tomography (CT) is a painless test that uses an x-ray machine to take clear, detailed pictures of your heart. For further information please visit [email or web address] Please follow instruction sheet as given.     Follow-Up: At [facility], you and your health needs are our priority.  As part of our continuing mission to provide you with exceptional heart care, we have created designated Provider Care Teams.  These Care Teams include your primary Cardiologist (physician) and Advanced Practice Providers (APPs -  Physician Assistants and Nurse Practitioners) who all work together to provide you with the care you need, when you need it.  We recommend signing up for the patient  portal called "MyChart".  Sign up information is provided on this After Visit Summary.  MyChart is used to connect with patients for Virtual Visits (Telemedicine).  Patients are able to view lab/test results, encounter notes, upcoming appointments, etc.  Non-urgent messages can be sent to your provider as well.   To learn more about what you can do with MyChart, go to [email or web address].    Your next appointment:   To be determined based on results  The format for your next appointment:   In Person  Provider:   You may see Lance Muss, MD or one of the following Advanced Practice Providers on your designated Care Team:   Ronie Spies, PA-C Jacolyn Reedy, PA-C   Other Instructions Christena Deem- Long Term Monitor Instructions  Your physician has requested you wear a ZIO patch monitor for 14 days.  This is a single patch monitor. [employer] supplies one patch monitor per enrollment. Additional stickers are not available. Please do not apply patch if you will be having a Nuclear Stress Test,  Echocardiogram, Cardiac CT, MRI, or Chest Xray during the period you would be wearing the  monitor. The patch cannot be worn during these tests. You cannot remove and re-apply the  ZIO XT patch monitor.  Your ZIO patch monitor will be mailed 3 day USPS to your address on file. It may take 3-5 days  to receive your monitor after you have been enrolled.  Once you have received your monitor, please review the enclosed instructions. Your monitor  has already been registered assigning a specific monitor serial # to you.  Billing and Patient Assistance  Program Information  We have supplied [employer] with any of your insurance information on file for billing purposes. [employer] offers a sliding scale Patient Assistance Program for patients that do not have  insurance, or whose insurance does not completely cover the cost of the ZIO monitor.  You must apply for the Patient Assistance Program to qualify for  this discounted rate.  To apply, please call [employer] at [phone], select option 4, select option 2, ask to apply for  Patient Assistance Program. Meredeth Ide will ask your household income, and how many people  are in your household. They will quote your out-of-pocket cost based on that information.  [employer] will also be able to set up a 71-month, interest-free payment plan if needed.  Applying the monitor   Shave hair from upper left chest.  Hold abrader disc by orange tab. Rub abrader in 40 strokes over the upper left chest as  indicated in your monitor instructions.  Clean area with 4 enclosed alcohol pads. Let dry.  Apply patch as indicated in monitor instructions. Patch will be placed under collarbone on left  side of chest with arrow pointing upward.  Rub patch adhesive wings for 2 minutes. Remove white label marked "1". Remove the white  label marked "2". Rub patch adhesive wings for 2 additional minutes.  While looking in a mirror, press and release button in center of patch. A small green light will  flash 3-4 times. This will be your only indicator that the monitor has been turned on.  Do not shower for the first 24 hours. You may shower after the first 24 hours.  Press the button if you feel a symptom. You will hear a small click. Record Date, Time and  Symptom in the Patient Logbook.  When you are ready to remove the patch, follow instructions on the last 2 pages of Patient  Logbook. Stick patch monitor onto the last page of Patient Logbook.  Place Patient Logbook in the blue and white box. Use locking tab on box and tape box closed  securely. The blue and white box has prepaid postage on it. Please place it in the mailbox as  soon as possible. Your physician should have your test results approximately 7 days after the  monitor has been mailed back to [facility].  Call [facility] Customer Care at [phone] if you have questions regarding  your ZIO XT patch monitor.  Call them immediately if you see an orange light blinking on your  monitor.  If your monitor falls off in less than 4 days, contact our Monitor department at [phone].  If your monitor becomes loose or falls off after 4 days call [employer] at [phone] for  suggestions on securing your monitor     Your cardiac CT will be scheduled at one of the below locations:   [facility] [address] [place], [place] [address] [phone]  OR  [facility] [address] [place], [place] [address] [phone]  If scheduled at [facility], please arrive at the [facility] main entrance (entrance A) of [place] 30 minutes prior to test start time. Proceed to the [facility] Radiology Department (first floor) to check-in and test prep.  If scheduled at [facility], please arrive 15 mins early for check-in and test prep.  Please follow these instructions carefully (unless otherwise directed):    On the Night Before the Test: Be sure to Drink plenty of  water. Do not consume any caffeinated/decaffeinated beverages or chocolate 12 hours prior to your test. Do not take any antihistamines 12 hours prior to your test.   On the Day of the Test: Drink plenty of water until 1 hour prior to the test. Do not eat any food 4 hours prior to the test. You may take your regular medications prior to the test.  Take metoprolol (Lopressor) two hours prior to test. Dose is 100 mg HOLD Furosemide/Hydrochlorothiazide morning of the test. FEMALES- please wear underwire-free bra if available          After the Test: Drink plenty of water. After receiving IV contrast, you may experience a mild flushed feeling. This is normal. On occasion, you may experience a mild rash up to 24 hours after the test. This is not dangerous. If this occurs, you can take Benadryl 25 mg and increase your fluid  intake. If you experience trouble breathing, this can be serious. If it is severe call 911 IMMEDIATELY. If it is mild, please call our office. If you take any of these medications: Glipizide/Metformin, Avandament, Glucavance, please do not take 48 hours after completing test unless otherwise instructed.   Once we have confirmed authorization from your insurance company, we will call you to set up a date and time for your test. Based on how quickly your insurance processes prior authorizations requests, please allow up to 4 weeks to be contacted for scheduling your Cardiac CT appointment. Be advised that routine Cardiac CT appointments could be scheduled as many as 8 weeks after your provider has ordered it.  For non-scheduling related questions, please contact the cardiac imaging nurse navigator should you have any questions/concerns: Rockwell Alexandria, Cardiac Imaging Nurse Navigator Larey Brick, Cardiac Imaging Nurse Navigator [facility] Direct Office Dial: [phone]   For scheduling needs, including cancellations and rescheduling, please call [place], [phone].

## 2021-05-22 NOTE — Therapy (Signed)
[place], [name], [address] Phone: [phone]   Fax:  [phone]  Physical Therapy Treatment  Patient Details  Name: Charlotte Dickerson MRN: [id number] Date of Birth: Feb 28, 1961 Referring Provider (PT): Naiping M. Erlinda Hong, MD   Encounter Date: 05/22/2021   PT End of Session - 05/22/21 1207     Visit Number 3    Number of Visits 16    Authorization Type [employer]    PT Start Time 1100    PT Stop Time 1155    PT Time Calculation (min) 55 min             Past Medical History:  Diagnosis Date   Allergies    Arthritis    Diabetes ([place])    HTN (hypertension)     Past Surgical History:  Procedure Laterality Date   ROTATOR CUFF REPAIR  06/2020    There were no vitals filed for this visit.   Subjective Assessment - 05/22/21 1104     Subjective I am sore, it is not really pain.    Currently in Pain? No/denies   just soreness   Aggravating Factors  reaching behind back    Pain Relieving Factors avoid motions                [facility] PT Assessment - 05/22/21 0001       ROM / Strength   AROM / PROM / Strength PROM      AROM   Right Shoulder Flexion 105 Degrees   110 in supine   Right Shoulder ABduction 105 Degrees      PROM   PROM Assessment Site Shoulder    Right/Left Shoulder Right    Right Shoulder Flexion 120 Degrees   limited by pain                          [facility] Adult PT Treatment/Exercise - 05/22/21 0001       Shoulder Exercises: Supine   Horizontal ABduction 15 reps    Theraband Level (Shoulder Horizontal ABduction) Level 2 (Red)    External Rotation 15 reps    Theraband Level (Shoulder External Rotation) Level 2 (Red)    Other Supine Exercises supine pullovers      Shoulder Exercises: Standing   Other Standing Exercises IR AAROM with opposite arm      Shoulder Exercises: Pulleys   Flexion 2 minutes    Scaption 1 minute      Shoulder Exercises:  ROM/Strengthening   UBE (Upper Arm Bike) L1.5  2 minutes forward and back    Other ROM/Strengthening Exercises shoulder flexion wall slide x 10, shoulder abduction wall slide x 10      Shoulder Exercises: Stretch   Corner Stretch Limitations doorway stretches per HEP- 3 ways      Modalities   Modalities Moist Heat      Moist Heat Therapy   Number Minutes Moist Heat 10 Minutes    Moist Heat Location Shoulder      Manual Therapy   Manual Therapy Soft tissue mobilization    Joint Mobilization A/P and inferior GHJ mobs    Soft tissue mobilization STW right deltoid/bicep    Passive ROM 4 way right shoulder to tolerance                      PT Short Term Goals - 05/22/21 1206  PT SHORT TERM GOAL #1   Title Pt will be I and compliant with initial HEP.    Baseline needs cues, limited compliance thus far    Period Weeks    Status On-going    Target Date 05/12/21      PT SHORT TERM GOAL #2   Title Pt will demonstrate functional IR to at least L1 and ER to at least C7, in order to perform hygiene on low back and hair in the shower.    Baseline glute and T2    Time 4    Period Weeks    Status Partially Met    Target Date 05/19/21               PT Long Term Goals - 04/21/21 1419       PT LONG TERM GOAL #1   Title Pt will demonstrate functional IR to at least T8 and ER to at least T1, in order to perform hygiene, grooming, don/doff bra and other clothing.    Baseline currently ER to glute and IR to occiput    Time 8    Period Weeks    Status New    Target Date 06/16/21      PT LONG TERM GOAL #2   Title Pt will increase flexion and abduction ROM to 145 or greater for symmetry with L UE    Baseline see flowsheet    Time 8    Period Weeks    Status New    Target Date 06/16/21      PT LONG TERM GOAL #3   Title Pt will be able to sleep through the night without waking up secondary to R shoulder pain.    Time 8    Period Weeks    Status New     Target Date 06/16/21      PT LONG TERM GOAL #4   Title Pt will decrease R shoulder pain to </= 3/10 with all functional activity, including I/ADLs and work.    Baseline 8/10 at worst    Time 8    Period Weeks    Status New    Target Date 06/16/21      PT LONG TERM GOAL #5   Title Pt will increase FOTO ability to at least 66% ability, in order to demonstrate meaningful change in perceived level of functional ability.    Baseline 51%    Time 8    Period Weeks    Status New    Target Date 06/16/21                   Plan - 05/22/21 1106     Clinical Impression Statement Pt reports increased compliance with HEP over the weekend and reports soreness today, no pain on arrival. Continued with ROM focus with PROM, AAROM as tolerated. STW performed to lateral upper arm to decrease tenderness. HMP at end of sesssion. Pts AROM is improving.    PT Next Visit Plan Assess response to HEP/update PRN, manual therapy to address joint and soft tissue restrictions, increase R shoulder ROM, ice/heat as needed; work on IR ROM    PT [place] and Agree with Plan of Care Patient             Patient will benefit from skilled therapeutic intervention in order to improve the following deficits and impairments:  Hypomobility, Decreased activity tolerance, Decreased range of motion, Decreased strength, Increased fascial restricitons,  Impaired flexibility, Postural dysfunction, Improper body mechanics, Impaired UE functional use, Impaired perceived functional ability, Pain  Visit Diagnosis: Chronic right shoulder pain  Abnormal posture  Muscle weakness (generalized)  Stiffness of right shoulder, not elsewhere classified     Problem List There are no problems to display for this patient.   Dorene Ar, [place] 05/22/2021, 12:07 PM  [facility] [address] [place], [name], [address] Phone:  [phone]   Fax:  [phone]  Name: Charlotte Dickerson MRN: [id number] Date of Birth: March 25, 1961

## 2021-05-23 ENCOUNTER — Ambulatory Visit (INDEPENDENT_AMBULATORY_CARE_PROVIDER_SITE_OTHER): Payer: 59

## 2021-05-23 DIAGNOSIS — R002 Palpitations: Secondary | ICD-10-CM

## 2021-05-23 NOTE — Progress Notes (Unsigned)
Enrolled patient for a 14 day Zio XT  monitor to be mailed to patients home  °

## 2021-05-26 ENCOUNTER — Ambulatory Visit: Payer: 59 | Attending: Orthopaedic Surgery

## 2021-05-26 ENCOUNTER — Other Ambulatory Visit: Payer: Self-pay

## 2021-05-26 ENCOUNTER — Other Ambulatory Visit: Payer: 59 | Admitting: *Deleted

## 2021-05-26 DIAGNOSIS — R002 Palpitations: Secondary | ICD-10-CM

## 2021-05-26 DIAGNOSIS — M25511 Pain in right shoulder: Secondary | ICD-10-CM | POA: Insufficient documentation

## 2021-05-26 DIAGNOSIS — M7501 Adhesive capsulitis of right shoulder: Secondary | ICD-10-CM | POA: Insufficient documentation

## 2021-05-26 DIAGNOSIS — R293 Abnormal posture: Secondary | ICD-10-CM | POA: Insufficient documentation

## 2021-05-26 DIAGNOSIS — M25611 Stiffness of right shoulder, not elsewhere classified: Secondary | ICD-10-CM | POA: Insufficient documentation

## 2021-05-26 DIAGNOSIS — R072 Precordial pain: Secondary | ICD-10-CM

## 2021-05-26 DIAGNOSIS — M6281 Muscle weakness (generalized): Secondary | ICD-10-CM | POA: Diagnosis present

## 2021-05-26 DIAGNOSIS — G8929 Other chronic pain: Secondary | ICD-10-CM | POA: Diagnosis present

## 2021-05-26 LAB — BASIC METABOLIC PANEL
BUN/Creatinine Ratio: 17 (ref 9–23)
BUN: 14 mg/dL (ref 6–24)
CO2: 21 mmol/L (ref 20–29)
Calcium: 10.8 mg/dL — ABNORMAL HIGH (ref 8.7–10.2)
Chloride: 102 mmol/L (ref 96–106)
Creatinine, Ser: 0.84 mg/dL (ref 0.57–1.00)
Glucose: 147 mg/dL — ABNORMAL HIGH (ref 65–99)
Potassium: 4.7 mmol/L (ref 3.5–5.2)
Sodium: 141 mmol/L (ref 134–144)
eGFR: 80 mL/min/{1.73_m2} (ref >59)

## 2021-05-26 NOTE — Therapy (Signed)
[place], [name], [address] Phone: [phone]   Fax:  [phone]  Physical Therapy Treatment  Patient Details  Name: Charlotte Dickerson MRN: [id number] Date of Birth: [birth year] Referring Provider (PT): Naiping M. Erlinda Hong, MD   Encounter Date: 05/26/2021   PT End of Session - 05/26/21 1213     Visit Number 4    Number of Visits 16    Date for PT Re-Evaluation 06/30/21    Authorization Type [employer]    PT Start Time 1106    PT Stop Time 1205    PT Time Calculation (min) 59 min    Activity Tolerance Patient tolerated treatment well;Patient limited by pain    Behavior During Therapy [facility] for tasks assessed/performed             Past Medical History:  Diagnosis Date   Allergies    Arthritis    Diabetes ([place])    HTN (hypertension)     Past Surgical History:  Procedure Laterality Date   ROTATOR CUFF REPAIR  06/2020    There were no vitals filed for this visit.   Subjective Assessment - 05/26/21 1110     Subjective My R sholder strength and ROM are both improving a little    Diagnostic tests R shoulder XR: Arthritic changes to the [facility] joint.  Circular lucencies within the humeral   head likely from recent rotator cuff anchor placement    Patient Stated Goals I want to be able to use my arm and stretch it out    Currently in Pain? Yes    Pain Score 4     Pain Location Shoulder    Pain Orientation Right;Anterior;Posterior    Pain Descriptors / Indicators Tightness;Sore    Pain Type Chronic pain    Pain Onset More than a month ago    Pain Frequency Intermittent    Multiple Pain Sites --                [facility] PT Assessment - 05/26/21 0001       AROM   Right Shoulder Flexion 110 Degrees      PROM   Right Shoulder Flexion 130 Degrees   limited by pain and tightness                          [facility] Adult PT Treatment/Exercise - 05/26/21 0001       Exercises    Exercises Shoulder      Shoulder Exercises: Standing   External Rotation Right;15 reps    External Rotation Limitations red Tband    Internal Rotation Right;15 reps    Internal Rotation Limitations red Tband    Extension Both;15 reps    Theraband Level (Shoulder Extension) Level 2 (Red)    Row Both;15 reps    Theraband Level (Shoulder Row) Level 2 (Red)      Shoulder Exercises: Pulleys   Flexion 1 minute   motion, no sustained stretch   Flexion Limitations Then 5x; 20sec stetch    Scaption 1 minute   motion, no sustained stretch     Shoulder Exercises: ROM/Strengthening   UBE (Upper Arm Bike) L1.5  2 minutes forward and back      Shoulder Exercises: Stretch   Cross Chest Stretch 3 reps;20 seconds    Internal Rotation Stretch 3 reps   20 sec   Internal Rotation Stretch Limitations c towel stretch  Modalities   Modalities Moist Heat      Moist Heat Therapy   Number Minutes Moist Heat 15 Minutes    Moist Heat Location Shoulder   R     Manual Therapy   Joint Mobilization A/P, inferior, distraction GHJ mobs grade ll and lll    Passive ROM 4 way right shoulder to tolerance                      PT Short Term Goals - 05/22/21 1206       PT SHORT TERM GOAL #1   Title Pt will be I and compliant with initial HEP.    Baseline needs cues, limited compliance thus far    Period Weeks    Status On-going    Target Date 05/12/21      PT SHORT TERM GOAL #2   Title Pt will demonstrate functional IR to at least L1 and ER to at least C7, in order to perform hygiene on low back and hair in the shower.    Baseline glute and T2    Time 4    Period Weeks    Status Partially Met    Target Date 05/19/21               PT Long Term Goals - 04/21/21 1419       PT LONG TERM GOAL #1   Title Pt will demonstrate functional IR to at least T8 and ER to at least T1, in order to perform hygiene, grooming, don/doff bra and other clothing.    Baseline currently ER to glute  and IR to occiput    Time 8    Period Weeks    Status New    Target Date 06/16/21      PT LONG TERM GOAL #2   Title Pt will increase flexion and abduction ROM to 145 or greater for symmetry with L UE    Baseline see flowsheet    Time 8    Period Weeks    Status New    Target Date 06/16/21      PT LONG TERM GOAL #3   Title Pt will be able to sleep through the night without waking up secondary to R shoulder pain.    Time 8    Period Weeks    Status New    Target Date 06/16/21      PT LONG TERM GOAL #4   Title Pt will decrease R shoulder pain to </= 3/10 with all functional activity, including I/ADLs and work.    Baseline 8/10 at worst    Time 8    Period Weeks    Status New    Target Date 06/16/21      PT LONG TERM GOAL #5   Title Pt will increase FOTO ability to at least 66% ability, in order to demonstrate meaningful change in perceived level of functional ability.    Baseline 51%    Time 8    Period Weeks    Status New    Target Date 06/16/21                   Plan - 05/26/21 1215     Clinical Impression Statement PT was completed to address R shoulder ROM and strength. ROM was addressed per mobs, PROM, AROM, and stretches. PT's PROM and AROM were measured for flexion and both measures demonstrated small gains vs. the last PT session. Following the completion  of ROM therex, pt reported an increased R shoulder soreness which resolved after the completion of the strengthening exs and moist heat to the R shoulder.    Personal Factors and Comorbidities Profession;Time since onset of injury/illness/exacerbation;Comorbidity 3+    Comorbidities Arthritis, DM II, HTN    Examination-Activity Limitations Bathing;Lift;Hygiene/Grooming;Dressing;Sleep;Carry;Caring for Others;Reach Overhead    Examination-Participation Restrictions Cleaning;Meal Prep;Laundry;Community Activity    Stability/Clinical Decision Making Evolving/Moderate complexity    Clinical Decision Making  Moderate    Rehab Potential Good    PT Frequency --   1-2x/week   PT Duration 8 weeks    PT Treatment/Interventions ADLs/Self Care Home Management;Cryotherapy;Electrical Stimulation;Iontophoresis 21m/ml Dexamethasone;Moist Heat;Functional mobility training;Therapeutic activities;Therapeutic exercise;Neuromuscular re-education;Patient/family education;Manual techniques;Passive range of motion;Dry needling;Taping;Vasopneumatic Device;Spinal Manipulations;Joint Manipulations    PT Next Visit Plan Assess response to HEP/update PRN, manual therapy to address joint and soft tissue restrictions, increase R shoulder ROM, ice/heat as needed; work on IR ROM    PT Home Exercise Plan [id number]    Consulted and Agree with Plan of Care Patient             Patient will benefit from skilled therapeutic intervention in order to improve the following deficits and impairments:  Hypomobility, Decreased activity tolerance, Decreased range of motion, Decreased strength, Increased fascial restricitons, Impaired flexibility, Postural dysfunction, Improper body mechanics, Impaired UE functional use, Impaired perceived functional ability, Pain  Visit Diagnosis: Chronic right shoulder pain  Abnormal posture  Muscle weakness (generalized)  Stiffness of right shoulder, not elsewhere classified     Problem List There are no problems to display for this patient.   AGar PontoMS, PT 05/26/21 12:27 PM   [facility]GCharter [place] [address]Phone: [phone]  Fax:  [phone] Name: LDenee BoederMRN: [id number] of Birth: 8Jul 16, 1962

## 2021-06-02 ENCOUNTER — Encounter: Payer: Self-pay | Admitting: Orthopaedic Surgery

## 2021-06-02 ENCOUNTER — Ambulatory Visit: Payer: 59 | Admitting: Orthopaedic Surgery

## 2021-06-02 DIAGNOSIS — M7501 Adhesive capsulitis of right shoulder: Secondary | ICD-10-CM | POA: Diagnosis not present

## 2021-06-02 NOTE — Progress Notes (Signed)
   Office Visit Note   Patient: Charlotte Dickerson           Date of Birth: March 22, 1961           MRN: [id number] Visit Date: 06/02/2021              Requested by: Janeece Agee, NP [address] [place],  [place] [address] PCP: Janeece Agee, NP   Assessment & Plan: Visit Diagnoses:  1. Adhesive capsulitis of right shoulder     Plan: Impression is improving right shoulder adhesive capsulitis.  We again discussed cortisone injection and the continuation of physical therapy.  She would like to hold off on the injection for now as she feels as though she is making enough progress with physical therapy.  Updated referral for therapy has been made.  Follow-up with [place] in 8 weeks time for repeat evaluation.  Call with concerns or questions in the meantime.  Follow-Up Instructions: Return in about 8 weeks (around 07/28/2021).   Orders:  Orders Placed This Encounter  Procedures   Ambulatory referral to Physical Therapy    No orders of the defined types were placed in this encounter.     Procedures: No procedures performed   Clinical Data: No additional findings.   Subjective: Chief Complaint  Patient presents with   Right Shoulder - Pain, Follow-up    HPI patient is a pleasant 60 year old female who comes in today for recheck of her right shoulder.  She is status post right shoulder rotator cuff repair almost a year ago back in [place].  She developed subsequent adhesive capsulitis and was seen in our office for this about 8 weeks ago.  Cortisone injection and physical therapy was recommended.  She canceled her injection but has continued with physical therapy.  She is not going as frequent as she would like due to her underlying work obligations but she feels that she is still making progress.  She is not taking anything for the pain.  Review of Systems as detailed in HPI.  All others reviewed are negative.   Objective: Vital Signs: There were no vitals taken for this  visit.  Physical Exam well-developed well-nourished female no acute distress.  Alert oriented x3.  Ortho Exam right shoulder exam reveals forward flexion to approximately 150 degrees, internal rotation to L5.  She is neurovascular intact distally.  Specialty Comments:  No specialty comments available.  Imaging: No new imaging   PMFS History: There are no problems to display for this patient.  Past Medical History:  Diagnosis Date   Allergies    Arthritis    Diabetes ([facility])    HTN (hypertension)     Family History  Problem Relation Age of Onset   Diabetes Mother    Heart attack Mother    Hypertension Mother    Stroke Mother    Depression Sister     Past Surgical History:  Procedure Laterality Date   ROTATOR CUFF REPAIR  06/2020   Social History   Occupational History   Not on file  Tobacco Use   Smoking status: Former    Pack years: 0.00    Types: Cigarettes   Smokeless tobacco: Never  Substance and Sexual Activity   Alcohol use: Not Currently    Alcohol/week: 3.0 - 4.0 standard drinks    Types: 3 - 4 Glasses of wine per week   Drug use: Never   Sexual activity: Not Currently

## 2021-06-07 ENCOUNTER — Telehealth (HOSPITAL_COMMUNITY): Payer: Self-pay | Admitting: Emergency Medicine

## 2021-06-07 NOTE — Telephone Encounter (Signed)
Reaching out to patient to offer assistance regarding upcoming cardiac imaging study; pt verbalizes understanding of appt date/time, parking situation and where to check in, pre-test NPO status and medications ordered, and verified current allergies; name and call back number provided for further questions should they arise Rockwell Alexandria RN Navigator Cardiac Imaging [facility] [phone] office [phone] cell  Denies iv issues Denies claustro 100mg  metoprolol tartate

## 2021-06-08 ENCOUNTER — Telehealth (HOSPITAL_COMMUNITY): Payer: Self-pay | Admitting: Emergency Medicine

## 2021-06-08 NOTE — Telephone Encounter (Signed)
Calling patient to inform her that we do not have an auth from her insur company for CCTA tomorrow. Will therefore be cancelling her appt to avoid unexpected bill.   Left message for call back with instructions to r/s her appt.  Rockwell Alexandria RN Navigator Cardiac Imaging [facility] [phone] Office  [phone] Cell

## 2021-06-09 ENCOUNTER — Ambulatory Visit (HOSPITAL_COMMUNITY): Admission: RE | Admit: 2021-06-09 | Payer: 59 | Source: Ambulatory Visit

## 2021-06-12 ENCOUNTER — Ambulatory Visit: Payer: 59 | Admitting: Physical Therapy

## 2021-06-17 ENCOUNTER — Ambulatory Visit: Payer: 59

## 2021-06-17 ENCOUNTER — Other Ambulatory Visit: Payer: Self-pay

## 2021-06-17 DIAGNOSIS — M25511 Pain in right shoulder: Secondary | ICD-10-CM

## 2021-06-17 DIAGNOSIS — R293 Abnormal posture: Secondary | ICD-10-CM

## 2021-06-17 DIAGNOSIS — M25611 Stiffness of right shoulder, not elsewhere classified: Secondary | ICD-10-CM

## 2021-06-17 DIAGNOSIS — M6281 Muscle weakness (generalized): Secondary | ICD-10-CM

## 2021-06-17 DIAGNOSIS — M7501 Adhesive capsulitis of right shoulder: Secondary | ICD-10-CM

## 2021-06-17 DIAGNOSIS — G8929 Other chronic pain: Secondary | ICD-10-CM

## 2021-06-17 NOTE — Therapy (Signed)
[facility] [address] [place], [place], [address] Phone: [phone]   Fax:  [phone]  Physical Therapy Treatment/Re-eval  Patient Details  Name: Charlotte Dickerson MRN: [id number] Date of Birth: 1961-04-24 Referring Provider (PT): Naiping M. Roda Shutters, MD   Encounter Date: 06/17/2021   PT End of Session - 06/17/21 1106     Visit Number 5    Number of Visits 13    Date for PT Re-Evaluation 08/19/21    Authorization Type [employer]    PT Start Time 0815    PT Stop Time 0900    PT Time Calculation (min) 45 min    Activity Tolerance Patient tolerated treatment well;Patient limited by pain    Behavior During Therapy [facility] for tasks assessed/performed             Past Medical History:  Diagnosis Date   Allergies    Arthritis    Diabetes (HCC)    HTN (hypertension)     Past Surgical History:  Procedure Laterality Date   ROTATOR CUFF REPAIR  06/2020    There were no vitals filed for this visit.   Subjective Assessment - 06/17/21 0823     Subjective Pt had a strain on her R shoulder handling a pt 4 days ago who began to slipped during a transfer. pt reports little to no pain at rest.    Diagnostic tests R shoulder XR: Arthritic changes to the [facility] joint.  Circular lucencies within the humeral   head likely from recent rotator cuff anchor placement    Patient Stated Goals I want to be able to use my arm and stretch it out    Currently in Pain? Yes    Pain Score 5    with movement. o to little pain at rest   Pain Location Shoulder    Pain Orientation Right    Pain Descriptors / Indicators Aching;Tightness    Pain Type Chronic pain    Pain Onset More than a month ago    Pain Frequency Intermittent    Aggravating Factors  Reaching behaind back and raising arm                [facility] PT Assessment - 06/17/21 0001       PROM   Right/Left Shoulder Right;Left    Right Shoulder Flexion 130 Degrees   limited by pain  and tightness   Right Shoulder ABduction 87 Degrees   limited by pain and tightness   Right Shoulder Internal Rotation 50 Degrees   limited by pain and tightness   Right Shoulder External Rotation 60 Degrees   limited by pain and tightness   Left Shoulder Flexion 160 Degrees    Left Shoulder ABduction 160 Degrees    Left Shoulder Internal Rotation 90 Degrees    Left Shoulder External Rotation 78 Degrees                           [facility] Adult PT Treatment/Exercise - 06/17/21 0001       Exercises   Exercises Shoulder      Shoulder Exercises: Stretch   Internal Rotation Stretch 3 reps   20 sec   Internal Rotation Stretch Limitations sleeper stretch    External Rotation Stretch 3 reps;20 seconds   Doorway   Wall Stretch - Flexion 3 reps;20 seconds   along ulnar side of hand   Wall Stretch - ABduction 3 reps;20 seconds  along ulnar side of hand                   PT Education - 06/17/21 1056     Education Details Updated HEP. Education re: course recovery with adhesive capulitis, pt appears to be in the thawing stage.    Person(s) Educated Patient    Methods Explanation;Demonstration;Tactile cues;Verbal cues;Handout    Comprehension Verbalized understanding;Returned demonstration;Verbal cues required;Tactile cues required              PT Short Term Goals - 06/17/21 1109       PT SHORT TERM GOAL #1   Title Pt will be I and compliant with initial HEP.    Status On-going    Target Date 08/19/21      PT SHORT TERM GOAL #2   Title Pt will demonstrate functional IR to at least L1 and ER to at least C7, in order to perform hygiene on low back and hair in the shower.    Baseline glute and T2    Status On-going    Target Date 08/19/21               PT Long Term Goals - 06/17/21 1111       PT LONG TERM GOAL #1   Title Pt will demonstrate functional IR to at least T8 and ER to at least T1, in order to perform hygiene, grooming, don/doff bra and  other clothing.    Baseline currently ER to glute and IR to occiput    Status On-going    Target Date 08/19/21      PT LONG TERM GOAL #2   Title Pt will increase flexion and abduction ROM to 145 or greater for symmetry with L UE    Status On-going    Target Date 08/19/21      PT LONG TERM GOAL #3   Title Pt will be able to sleep through the night without waking up secondary to R shoulder pain.    Status On-going    Target Date 08/19/21      PT LONG TERM GOAL #4   Title Pt will decrease R shoulder pain to </= 3/10 with all functional activity, including I/ADLs and work.    Baseline 8/10 at worst    Status On-going    Target Date 08/19/21      PT LONG TERM GOAL #5   Title Pt will increase FOTO ability to at least 66% ability, in order to demonstrate meaningful change in perceived level of functional ability.    Baseline 51%    Status On-going    Target Date 08/19/21                   Plan - 06/17/21 1107     Clinical Impression Statement Pt attended her 4th PT session today. R shoulder ROM is making slow progress which is consistent with her Dx of shoulder adhesive capsulitis. Pt appears to be the in thawing stage of recovery. Pt's HEP was updated to address R shoulder flexion, abduction, IR and ER. Pt was educated on the general time frames for recovery for adhesive capsulitis. Pt will continue to benefit from skilled PT 1w8 to improve ROM, strength, and pain to optimize functional use of the R UE    Personal Factors and Comorbidities Profession;Time since onset of injury/illness/exacerbation;Comorbidity 3+    Comorbidities Arthritis, DM II, HTN    Examination-Activity Limitations Bathing;Lift;Hygiene/Grooming;Dressing;Sleep;Carry;Caring for Others;Reach Overhead    Examination-Participation Restrictions  Cleaning;Meal Prep;Laundry;Community Activity    Stability/Clinical Decision Making Evolving/Moderate complexity    Clinical Decision Making Moderate    Rehab Potential  Good    PT Frequency 1x / week    PT Duration 8 weeks    PT Treatment/Interventions ADLs/Self Care Home Management;Cryotherapy;Electrical Stimulation;Iontophoresis 4mg /ml Dexamethasone;Moist Heat;Functional mobility training;Therapeutic activities;Therapeutic exercise;Neuromuscular re-education;Patient/family education;Manual techniques;Passive range of motion;Dry needling;Taping;Vasopneumatic Device;Spinal Manipulations;Joint Manipulations    PT Next Visit Plan Assess response to HEP for ROM. Add strengthening exs. Ice/heat for pain management.    PT Home Exercise Plan [id number]    Consulted and Agree with Plan of Care Patient             Patient will benefit from skilled therapeutic intervention in order to improve the following deficits and impairments:  Hypomobility, Decreased activity tolerance, Decreased range of motion, Decreased strength, Increased fascial restricitons, Impaired flexibility, Postural dysfunction, Improper body mechanics, Impaired UE functional use, Impaired perceived functional ability, Pain  Visit Diagnosis: Chronic right shoulder pain  Abnormal posture  Muscle weakness (generalized)  Stiffness of right shoulder, not elsewhere classified  Adhesive capsulitis of right shoulder     Problem List There are no problems to display for this patient.  MS, PT 06/17/21 12:48 06/19/21  [facility] [address] [place], [place], [place] Phone: [phone]   Fax:  [phone]  Name: Charlotte Dickerson MRN: Jennefer Bravo Date of Birth: May 07, 1961

## 2021-06-19 ENCOUNTER — Ambulatory Visit: Payer: 59

## 2021-06-22 ENCOUNTER — Encounter: Payer: Self-pay | Admitting: Physical Therapy

## 2021-06-22 ENCOUNTER — Ambulatory Visit: Payer: 59 | Admitting: Physical Therapy

## 2021-06-22 ENCOUNTER — Other Ambulatory Visit: Payer: Self-pay

## 2021-06-22 DIAGNOSIS — R293 Abnormal posture: Secondary | ICD-10-CM

## 2021-06-22 DIAGNOSIS — M25511 Pain in right shoulder: Secondary | ICD-10-CM

## 2021-06-22 DIAGNOSIS — M6281 Muscle weakness (generalized): Secondary | ICD-10-CM

## 2021-06-22 DIAGNOSIS — G8929 Other chronic pain: Secondary | ICD-10-CM

## 2021-06-22 DIAGNOSIS — M7501 Adhesive capsulitis of right shoulder: Secondary | ICD-10-CM

## 2021-06-22 DIAGNOSIS — M25611 Stiffness of right shoulder, not elsewhere classified: Secondary | ICD-10-CM

## 2021-06-22 NOTE — Patient Instructions (Addendum)

## 2021-06-22 NOTE — Therapy (Signed)
[facility] [address] [place], [place], [address] Phone: [phone]   Fax:  [phone]  Physical Therapy Treatment  Patient Details  Name: Charlotte Dickerson MRN: [id number] Date of Birth: 1961-04-06 Referring Provider (PT): Naiping M. Roda Shutters, MD   Encounter Date: 06/22/2021   PT End of Session - 06/22/21 0937     Visit Number 6    Number of Visits 13    Date for PT Re-Evaluation 08/19/21    Authorization Type [employer]    PT Start Time [phone]    PT Stop Time [phone]    PT Time Calculation (min) 59 min    Activity Tolerance Patient tolerated treatment well;Patient limited by pain    Behavior During Therapy [facility] for tasks assessed/performed             Past Medical History:  Diagnosis Date   Allergies    Arthritis    Diabetes (HCC)    HTN (hypertension)     Past Surgical History:  Procedure Laterality Date   ROTATOR CUFF REPAIR  06/2020    There were no vitals filed for this visit.   Subjective Assessment - 06/22/21 0854     Subjective Pt reports she has 7/10 pain when using the pulleys but no pain when she is at rest.  Any time she uses her arm she has pain    Limitations House hold activities;Lifting    Diagnostic tests R shoulder XR: Arthritic changes to the [facility] joint.  Circular lucencies within the humeral   head likely from recent rotator cuff anchor placement    Pain Score 7     Pain Location Shoulder    Pain Orientation Right    Pain Descriptors / Indicators Aching;Tightness    Pain Type Chronic pain    Pain Onset More than a month ago    Pain Frequency Intermittent                [facility] PT Assessment - 06/22/21 0001       AROM   Right Shoulder Flexion 118 Degrees   post TPDN   Right Shoulder ABduction 108 Degrees   post TPDN   Right Shoulder Internal Rotation --   to L-1   Left Shoulder Flexion --    Left Shoulder ABduction --      PROM   Right Shoulder Flexion 138 Degrees   after TPDN  supine   Right Shoulder ABduction 116 Degrees   after TPDN                          [facility] Adult PT Treatment/Exercise - 06/22/21 0001       Exercises   Exercises Shoulder      Shoulder Exercises: Standing   External Rotation 10 reps    Theraband Level (Shoulder External Rotation) Level 2 (Red)    External Rotation Limitations x2    Internal Rotation Right;Theraband;10 reps    Internal Rotation Limitations x2    Extension --    Theraband Level (Shoulder Extension) --    Row --    Theraband Level (Shoulder Row) --    Other Standing Exercises serratus on wall with yellow t band x 10      Shoulder Exercises: Pulleys   Flexion 1 minute   motion, no sustained stretch   Flexion Limitations Then 5x; 20sec stetch    Scaption 1 minute   motion, no sustained stretch  Shoulder Exercises: Stretch   Internal Rotation Stretch 3 reps   20 sec   Internal Rotation Stretch Limitations sleeper stretch    Wall Stretch - Flexion 3 reps;20 seconds   along ulnar side of hand post TPDN   Wall Stretch - ABduction 3 reps;20 seconds   along ulnar side of hand post TPDN     Modalities   Modalities Moist Heat      Moist Heat Therapy   Number Minutes Moist Heat 15 Minutes    Moist Heat Location Shoulder   R     Manual Therapy   Manual Therapy Joint mobilization;Soft tissue mobilization    Joint Mobilization A/P, inferior, distraction GHJ mobs grade ll and lll    Soft tissue mobilization R sub scapularis, teres, pec major and minor    Passive ROM 4 way right shoulder to tolerance              Trigger Point Dry Needling - 06/22/21 0001     Consent Given? Yes    Education Handout Provided Yes    Muscles Treated Head and Neck Upper trapezius   R only   Muscles Treated Upper Quadrant Pectoralis minor;Pectoralis major;Subscapularis;Teres major   R only   Other Dry Needling 50 mm .30    Upper Trapezius Response Twitch reponse elicited;Palpable increased muscle length     Pectoralis Major Response Twitch response elicited;Palpable increased muscle length    Pectoralis Minor Response Twitch response elicited;Palpable increased muscle length    Subscapularis Response Twitch response elicited;Palpable increased muscle length   marked twitch response   Teres major Response Twitch response elicited;Palpable increased muscle length                  PT Education - 06/22/21 0952     Education Details added bil ER and serratus with yellow t band to HEP  education on TPDN    Person(s) Educated Patient    Methods Explanation;Demonstration;Tactile cues;Verbal cues;Handout    Comprehension Verbalized understanding;Returned demonstration              PT Short Term Goals - 06/17/21 1109       PT SHORT TERM GOAL #1   Title Pt will be I and compliant with initial HEP.    Status On-going    Target Date 08/19/21      PT SHORT TERM GOAL #2   Title Pt will demonstrate functional IR to at least L1 and ER to at least C7, in order to perform hygiene on low back and hair in the shower.    Baseline glute and T2    Status On-going    Target Date 08/19/21               PT Long Term Goals - 06/17/21 1111       PT LONG TERM GOAL #1   Title Pt will demonstrate functional IR to at least T8 and ER to at least T1, in order to perform hygiene, grooming, don/doff bra and other clothing.    Baseline currently ER to glute and IR to occiput    Status On-going    Target Date 08/19/21      PT LONG TERM GOAL #2   Title Pt will increase flexion and abduction ROM to 145 or greater for symmetry with L UE    Status On-going    Target Date 08/19/21      PT LONG TERM GOAL #3   Title Pt will be able to  sleep through the night without waking up secondary to R shoulder pain.    Status On-going    Target Date 08/19/21      PT LONG TERM GOAL #4   Title Pt will decrease R shoulder pain to </= 3/10 with all functional activity, including I/ADLs and work.    Baseline  8/10 at worst    Status On-going    Target Date 08/19/21      PT LONG TERM GOAL #5   Title Pt will increase FOTO ability to at least 66% ability, in order to demonstrate meaningful change in perceived level of functional ability.    Baseline 51%    Status On-going    Target Date 08/19/21                   Plan - 06/22/21 0857     Clinical Impression Statement Ms Presas enters clinic with 7/10 pain in R shld when moving ( flexing and abd)  Pt consented to TPDN and was closely monitored.  Pt initally anxious but was able to tolerate TPDN well. with increased AROM.  AROM R flex 118, abd 108  PROM flex R 138, abd 116.  Pt was able to internally rotate to thumb at L1.   Pt given bil ER exercise and reviewed HEP this session  continue with POC    Personal Factors and Comorbidities Profession;Time since onset of injury/illness/exacerbation;Comorbidity 3+    Comorbidities Arthritis, DM II, HTN    Examination-Activity Limitations Bathing;Lift;Hygiene/Grooming;Dressing;Sleep;Carry;Caring for Others;Reach Overhead    Examination-Participation Restrictions Cleaning;Meal Prep;Laundry;Community Activity    PT Frequency 1x / week    PT Duration 8 weeks    PT Treatment/Interventions ADLs/Self Care Home Management;Cryotherapy;Electrical Stimulation;Iontophoresis 4mg /ml Dexamethasone;Moist Heat;Functional mobility training;Therapeutic activities;Therapeutic exercise;Neuromuscular re-education;Patient/family education;Manual techniques;Passive range of motion;Dry needling;Taping;Vasopneumatic Device;Spinal Manipulations;Joint Manipulations    PT Next Visit Plan Assess response to HEP for ROM. Add strengthening exs. Ice/heat for pain management. Assess TPDN    PT Home Exercise Plan [id number]    Consulted and Agree with Plan of Care Patient             Patient will benefit from skilled therapeutic intervention in order to improve the following deficits and impairments:  Hypomobility, Decreased  activity tolerance, Decreased range of motion, Decreased strength, Increased fascial restricitons, Impaired flexibility, Postural dysfunction, Improper body mechanics, Impaired UE functional use, Impaired perceived functional ability, Pain  Visit Diagnosis: Chronic right shoulder pain  Abnormal posture  Muscle weakness (generalized)  Stiffness of right shoulder, not elsewhere classified  Adhesive capsulitis of right shoulder     Problem List There are no problems to display for this patient.  , PT, [employer] Certified Exercise Expert for the Aging Adult  06/22/21 9:59 AM Phone: [phone] Fax: [phone]   [facility] [address] [place], [place], [place] Phone: [phone]   Fax:  [phone]  Name: Charlotte Dickerson MRN: Jennefer Bravo Date of Birth: 1961/01/23

## 2021-06-23 ENCOUNTER — Telehealth: Payer: Self-pay | Admitting: Interventional Cardiology

## 2021-06-23 NOTE — Telephone Encounter (Signed)
Left message to call office

## 2021-06-23 NOTE — Telephone Encounter (Signed)
Patient returning call.

## 2021-06-23 NOTE — Telephone Encounter (Signed)
I spoke with patient and reviewed monitor results with her.  

## 2021-06-23 NOTE — Telephone Encounter (Signed)
Pt is returning a call from earlier today. Please advise pt further 

## 2021-06-26 ENCOUNTER — Other Ambulatory Visit: Payer: Self-pay

## 2021-06-26 ENCOUNTER — Encounter: Payer: Self-pay | Admitting: Physical Therapy

## 2021-06-26 ENCOUNTER — Ambulatory Visit: Payer: 59 | Attending: Orthopaedic Surgery | Admitting: Physical Therapy

## 2021-06-26 DIAGNOSIS — G8929 Other chronic pain: Secondary | ICD-10-CM | POA: Diagnosis present

## 2021-06-26 DIAGNOSIS — M7501 Adhesive capsulitis of right shoulder: Secondary | ICD-10-CM | POA: Insufficient documentation

## 2021-06-26 DIAGNOSIS — M25511 Pain in right shoulder: Secondary | ICD-10-CM | POA: Diagnosis present

## 2021-06-26 DIAGNOSIS — M25611 Stiffness of right shoulder, not elsewhere classified: Secondary | ICD-10-CM | POA: Insufficient documentation

## 2021-06-26 DIAGNOSIS — R293 Abnormal posture: Secondary | ICD-10-CM | POA: Insufficient documentation

## 2021-06-26 DIAGNOSIS — M6281 Muscle weakness (generalized): Secondary | ICD-10-CM | POA: Diagnosis present

## 2021-06-26 NOTE — Therapy (Addendum)
[facility] Outpatient Rehabilitation [facility] [address] [place], [place], [address] Phone: [phone]   Fax:  [phone]  Physical Therapy Treatment  Patient Details  Name: Charlotte Dickerson MRN: [id number] Date of Birth: 1961-10-09 Referring Provider (PT): Naiping M. Roda Shutters, MD   Encounter Date: 06/26/2021   PT End of Session - 06/26/21 1658     Visit Number 7    Number of Visits 13    Date for PT Re-Evaluation 08/19/21    Authorization Type [employer]    PT Start Time 1629    PT Stop Time 1700    PT Time Calculation (min) 31 min    Activity Tolerance Patient tolerated treatment well    Behavior During Therapy [facility] for tasks assessed/performed             Past Medical History:  Diagnosis Date   Allergies    Arthritis    Diabetes (HCC)    HTN (hypertension)     Past Surgical History:  Procedure Laterality Date   ROTATOR CUFF REPAIR  06/2020    There were no vitals filed for this visit.   Subjective Assessment - 06/26/21 1630     Subjective Patient reports she is more stiff today from working over the weekend.    Patient Stated Goals I want to be able to use my arm and stretch it out    Currently in Pain? Yes    Pain Score 4     Pain Location Shoulder    Pain Orientation Right    Pain Descriptors / Indicators Aching;Tightness    Pain Type Chronic pain    Pain Onset More than a month ago    Pain Frequency Intermittent                [facility] PT Assessment - 06/26/21 0001       AROM   Right Shoulder Flexion 115 Degrees                           [facility] Adult PT Treatment/Exercise - 06/26/21 0001       Exercises   Exercises Shoulder      Shoulder Exercises: Supine   Flexion 10 reps    Flexion Limitations AAROM using physioball      Shoulder Exercises: Sidelying   External Rotation 12 reps   2 sets   External Rotation Weight (lbs) 2    Other Sidelying Exercises Sidelying thoracic rotation 10 x 5 sec each       Shoulder Exercises: Standing   Other Standing Exercises Standing counter step-back stretch 5 x 5 sec      Shoulder Exercises: Stretch   Table Stretch - External Rotation Limitations 10 x 5 sec hold      Manual Therapy   Manual Therapy Soft tissue mobilization;Passive ROM    Joint Mobilization Right GHJ mobs primarily inferior and posterior at various ranges of elevation    Passive ROM Right shoulder all directions                    PT Education - 06/26/21 1657     Education Details HEP    Person(s) Educated Patient    Methods Explanation;Demonstration;Verbal cues    Comprehension Verbalized understanding;Returned demonstration;Verbal cues required;Need further instruction              PT Short Term Goals - 06/17/21 1109       PT SHORT TERM  GOAL #1   Title Pt will be I and compliant with initial HEP.    Status On-going    Target Date 08/19/21      PT SHORT TERM GOAL #2   Title Pt will demonstrate functional IR to at least L1 and ER to at least C7, in order to perform hygiene on low back and hair in the shower.    Baseline glute and T2    Status On-going    Target Date 08/19/21               PT Long Term Goals - 06/17/21 1111       PT LONG TERM GOAL #1   Title Pt will demonstrate functional IR to at least T8 and ER to at least T1, in order to perform hygiene, grooming, don/doff bra and other clothing.    Baseline currently ER to glute and IR to occiput    Status On-going    Target Date 08/19/21      PT LONG TERM GOAL #2   Title Pt will increase flexion and abduction ROM to 145 or greater for symmetry with L UE    Status On-going    Target Date 08/19/21      PT LONG TERM GOAL #3   Title Pt will be able to sleep through the night without waking up secondary to R shoulder pain.    Status On-going    Target Date 08/19/21      PT LONG TERM GOAL #4   Title Pt will decrease R shoulder pain to </= 3/10 with all functional activity, including  I/ADLs and work.    Baseline 8/10 at worst    Status On-going    Target Date 08/19/21      PT LONG TERM GOAL #5   Title Pt will increase FOTO ability to at least 66% ability, in order to demonstrate meaningful change in perceived level of functional ability.    Baseline 51%    Status On-going    Target Date 08/19/21                   Plan - 06/26/21 1659     Clinical Impression Statement Patient tolerated therapy well with no adverse effects. Therapy limited due to patient arriving late. She reported increased stiffness so focused on manual and stretching to improve motion. She did exhibit slight decrease in active elevation this visit. No changes made to HEP. Patient would benefit from continued skilled PT to progress motion and strength to reduce shoulder pain and maximize her functional ability with reaching.    PT Treatment/Interventions ADLs/Self Care Home Management;Cryotherapy;Electrical Stimulation;Iontophoresis 4mg /ml Dexamethasone;Moist Heat;Functional mobility training;Therapeutic activities;Therapeutic exercise;Neuromuscular re-education;Patient/family education;Manual techniques;Passive range of motion;Dry needling;Taping;Vasopneumatic Device;Spinal Manipulations;Joint Manipulations    PT Next Visit Plan Assess response to HEP for ROM. Add strengthening exs. Ice/heat for pain management. Assess TPDN    PT Home Exercise Plan [id number]    Consulted and Agree with Plan of Care Patient             Patient will benefit from skilled therapeutic intervention in order to improve the following deficits and impairments:  Hypomobility, Decreased activity tolerance, Decreased range of motion, Decreased strength, Increased fascial restricitons, Impaired flexibility, Postural dysfunction, Improper body mechanics, Impaired UE functional use, Impaired perceived functional ability, Pain  Visit Diagnosis: Chronic right shoulder pain  Stiffness of right shoulder, not elsewhere  classified  Muscle weakness (generalized)  Abnormal posture     Problem List There  are no problems to display for this patient.   Rosana Hoes, PT, DPT, LAT, ATC 06/26/21  5:52 PM Phone: [phone] Fax: [phone]   [facility] Outpatient Rehabilitation [facility] [address] [place], [place], [address] Phone: [phone]   Fax:  [phone]  Name: Presly Steinruck MRN: [id number] Date of Birth: [birth year]

## 2021-07-03 ENCOUNTER — Ambulatory Visit: Payer: 59 | Admitting: Physical Therapy

## 2021-07-07 ENCOUNTER — Other Ambulatory Visit (HOSPITAL_COMMUNITY): Payer: Self-pay | Admitting: Emergency Medicine

## 2021-07-07 ENCOUNTER — Telehealth (HOSPITAL_COMMUNITY): Payer: Self-pay | Admitting: Emergency Medicine

## 2021-07-07 DIAGNOSIS — R072 Precordial pain: Secondary | ICD-10-CM

## 2021-07-07 NOTE — Telephone Encounter (Signed)
Attempted to call patient regarding upcoming cardiac CT appointment. °Left message on voicemail with name and callback number °Janith Nielson RN Navigator Cardiac Imaging °[facility] °[phone] Office °[phone] Cell ° °

## 2021-07-10 ENCOUNTER — Other Ambulatory Visit: Payer: 59 | Admitting: *Deleted

## 2021-07-10 ENCOUNTER — Encounter: Payer: Self-pay | Admitting: Physical Therapy

## 2021-07-10 ENCOUNTER — Other Ambulatory Visit: Payer: Self-pay

## 2021-07-10 ENCOUNTER — Ambulatory Visit: Payer: 59 | Admitting: Physical Therapy

## 2021-07-10 DIAGNOSIS — M25611 Stiffness of right shoulder, not elsewhere classified: Secondary | ICD-10-CM

## 2021-07-10 DIAGNOSIS — M6281 Muscle weakness (generalized): Secondary | ICD-10-CM

## 2021-07-10 DIAGNOSIS — G8929 Other chronic pain: Secondary | ICD-10-CM

## 2021-07-10 DIAGNOSIS — R293 Abnormal posture: Secondary | ICD-10-CM

## 2021-07-10 DIAGNOSIS — M25511 Pain in right shoulder: Secondary | ICD-10-CM | POA: Diagnosis not present

## 2021-07-10 NOTE — Therapy (Signed)
[facility] Outpatient Rehabilitation [facility] [address] [place], [place], [address] Phone: [phone]   Fax:  [phone]  Physical Therapy Treatment  Patient Details  Name: Charlotte Dickerson MRN: [id number] Date of Birth: [birth year] Referring Provider (PT): Naiping M. Roda Shutters, MD   Encounter Date: 07/10/2021   PT End of Session - 07/10/21 1705     Visit Number 8    Number of Visits 13    Date for PT Re-Evaluation 08/19/21    Authorization Type [employer]    PT Start Time 1656    PT Stop Time 1740    PT Time Calculation (min) 44 min    Activity Tolerance Patient tolerated treatment well    Behavior During Therapy [facility] for tasks assessed/performed             Past Medical History:  Diagnosis Date   Allergies    Arthritis    Diabetes (HCC)    HTN (hypertension)     Past Surgical History:  Procedure Laterality Date   ROTATOR CUFF REPAIR  06/2020    There were no vitals filed for this visit.   Subjective Assessment - 07/10/21 1702     Subjective Patient reports she is doing well, shoulder is not bad. She is not as consistent with exercises as she would like but she tries to do them when she can. She states that she isn't really having pain right now, right shoulder is just tired.    Patient Stated Goals I want to be able to use my arm and stretch it out    Pain Score 0-No pain    Pain Location Shoulder    Pain Orientation Right    Pain Type Chronic pain    Pain Onset More than a month ago    Pain Frequency Intermittent                [facility] PT Assessment - 07/10/21 0001       AROM   Right Shoulder Flexion 123 Degrees   shrug noted     PROM   Right Shoulder Flexion 140 Degrees    Right Shoulder External Rotation 70 Degrees                           [facility] Adult PT Treatment/Exercise - 07/10/21 0001       Exercises   Exercises Shoulder      Shoulder Exercises: Supine   Flexion 10 reps   2 sets   Flexion  Limitations dowel press to overhead stretch with 2#      Shoulder Exercises: Standing   External Rotation 10 reps   2 sets   Theraband Level (Shoulder External Rotation) Level 1 (Yellow)    Internal Rotation 10 reps   2 sets   Theraband Level (Shoulder Internal Rotation) Level 1 (Yellow)    Flexion 10 reps   2 sets   Flexion Limitations UE Ranger    Row 15 reps   2 sets   Theraband Level (Shoulder Row) Level 1 (Yellow)      Manual Therapy   Manual Therapy Joint mobilization;Soft tissue mobilization;Passive ROM    Joint Mobilization Right GHJ mobs primarily inferior and posterior at various ranges of elevation    Soft tissue mobilization Right deltoid region    Passive ROM Right shoulder all directions                    PT  Education - 07/10/21 1704     Education Details HEP    Person(s) Educated Patient    Methods Explanation;Demonstration;Verbal cues    Comprehension Verbalized understanding;Returned demonstration;Verbal cues required;Need further instruction              PT Short Term Goals - 07/10/21 1739       PT SHORT TERM GOAL #1   Title Pt will be I and compliant with initial HEP.    Baseline continues to report inconsistency with HEP - 07/10/21    Status On-going    Target Date 08/19/21      PT SHORT TERM GOAL #2   Title Pt will demonstrate functional IR to at least L1 and ER to at least C7, in order to perform hygiene on low back and hair in the shower.    Baseline functional IR to L5 and functional ER to C7    Status On-going    Target Date 08/19/21               PT Long Term Goals - 06/17/21 1111       PT LONG TERM GOAL #1   Title Pt will demonstrate functional IR to at least T8 and ER to at least T1, in order to perform hygiene, grooming, don/doff bra and other clothing.    Baseline currently ER to glute and IR to occiput    Status On-going    Target Date 08/19/21      PT LONG TERM GOAL #2   Title Pt will increase flexion and  abduction ROM to 145 or greater for symmetry with L UE    Status On-going    Target Date 08/19/21      PT LONG TERM GOAL #3   Title Pt will be able to sleep through the night without waking up secondary to R shoulder pain.    Status On-going    Target Date 08/19/21      PT LONG TERM GOAL #4   Title Pt will decrease R shoulder pain to </= 3/10 with all functional activity, including I/ADLs and work.    Baseline 8/10 at worst    Status On-going    Target Date 08/19/21      PT LONG TERM GOAL #5   Title Pt will increase FOTO ability to at least 66% ability, in order to demonstrate meaningful change in perceived level of functional ability.    Baseline 51%    Status On-going    Target Date 08/19/21                   Plan - 07/10/21 1735     Clinical Impression Statement Patient tolerated therapy well with no adverse effects. Therapy focused on progressing shoulder mobility and motion, and progression of periscapular and rotator cuff strengthening. Patient does demonstrate improve motion of the right shoulder elevation this visit and tolerated banded strengthening well. She did report increases in shoulder discomfort at end ranges of motion with stretching and noted tightness of right deltoid so performed soft tissue work with good benefit. No changes made to HEP. Patient would benefit from continued skilled PT to progress motion and strength to reduce shoulder pain and maximize her functional ability with reaching.    PT Treatment/Interventions ADLs/Self Care Home Management;Cryotherapy;Electrical Stimulation;Iontophoresis 4mg /ml Dexamethasone;Moist Heat;Functional mobility training;Therapeutic activities;Therapeutic exercise;Neuromuscular re-education;Patient/family education;Manual techniques;Passive range of motion;Dry needling;Taping;Vasopneumatic Device;Spinal Manipulations;Joint Manipulations    PT Next Visit Plan Assess response to HEP for ROM. Add strengthening exs. Ice/heat  for pain management. Assess TPDN    PT Home Exercise Plan [id number]    Consulted and Agree with Plan of Care Patient             Patient will benefit from skilled therapeutic intervention in order to improve the following deficits and impairments:  Hypomobility, Decreased activity tolerance, Decreased range of motion, Decreased strength, Increased fascial restricitons, Impaired flexibility, Postural dysfunction, Improper body mechanics, Impaired UE functional use, Impaired perceived functional ability, Pain  Visit Diagnosis: Chronic right shoulder pain  Stiffness of right shoulder, not elsewhere classified  Muscle weakness (generalized)  Abnormal posture     Problem List There are no problems to display for this patient.   Rosana Hoes, PT, DPT, LAT, ATC 07/10/21  5:45 PM Phone: [phone] Fax: [phone]   [facility] Outpatient Rehabilitation [facility] [address] [place], [place], [address] Phone: [phone]   Fax:  [phone]  Name: Charlotte Dickerson MRN: [id number] Date of Birth: 1961-05-31

## 2021-07-11 ENCOUNTER — Ambulatory Visit (HOSPITAL_COMMUNITY): Payer: 59

## 2021-07-11 LAB — BASIC METABOLIC PANEL
BUN/Creatinine Ratio: 15 (ref 9–23)
BUN: 16 mg/dL (ref 6–24)
CO2: 24 mmol/L (ref 20–29)
Calcium: 10.1 mg/dL (ref 8.7–10.2)
Chloride: 103 mmol/L (ref 96–106)
Creatinine, Ser: 1.08 mg/dL — ABNORMAL HIGH (ref 0.57–1.00)
Glucose: 201 mg/dL — ABNORMAL HIGH (ref 65–99)
Potassium: 4.6 mmol/L (ref 3.5–5.2)
Sodium: 142 mmol/L (ref 134–144)
eGFR: 60 mL/min/{1.73_m2} (ref >59)

## 2021-07-12 ENCOUNTER — Telehealth: Payer: Self-pay

## 2021-07-12 NOTE — Telephone Encounter (Signed)
Pt wants appt in person. Please accommodate. Thank you

## 2021-07-13 ENCOUNTER — Encounter (HOSPITAL_COMMUNITY): Payer: Self-pay

## 2021-07-14 ENCOUNTER — Ambulatory Visit (HOSPITAL_COMMUNITY)
Admission: RE | Admit: 2021-07-14 | Discharge: 2021-07-14 | Disposition: A | Discharge status: Home / Self Care | Payer: 59 | Source: Ambulatory Visit | Attending: Interventional Cardiology | Admitting: Interventional Cardiology

## 2021-07-14 ENCOUNTER — Encounter (HOSPITAL_COMMUNITY): Payer: Self-pay

## 2021-07-14 ENCOUNTER — Other Ambulatory Visit: Payer: Self-pay

## 2021-07-14 DIAGNOSIS — R072 Precordial pain: Secondary | ICD-10-CM | POA: Diagnosis not present

## 2021-07-14 DIAGNOSIS — I2510 Atherosclerotic heart disease of native coronary artery without angina pectoris: Secondary | ICD-10-CM

## 2021-07-14 MED ORDER — METOPROLOL TARTRATE 5 MG/5ML IV SOLN: 5.0000 mg | Freq: Once | INTRAVENOUS | Status: AC

## 2021-07-14 MED ORDER — NITROGLYCERIN 0.4 MG SL SUBL
SUBLINGUAL_TABLET | SUBLINGUAL | Status: AC
  Filled 2021-07-14: qty 2

## 2021-07-14 MED ORDER — METOPROLOL TARTRATE 5 MG/5ML IV SOLN
INTRAVENOUS | Status: AC
  Administered 2021-07-14: 5 mg via INTRAVENOUS
  Filled 2021-07-14: qty 10

## 2021-07-14 MED ORDER — NITROGLYCERIN 0.4 MG SL SUBL
0.8000 mg | SUBLINGUAL_TABLET | Freq: Once | SUBLINGUAL | Status: AC
  Administered 2021-07-14: 0.8 mg via SUBLINGUAL

## 2021-07-14 MED ORDER — IOHEXOL 350 MG/ML SOLN
95.0000 mL | Freq: Once | INTRAVENOUS | Status: AC | PRN
  Administered 2021-07-14: 95 mL via INTRAVENOUS

## 2021-07-16 ENCOUNTER — Other Ambulatory Visit: Payer: Self-pay | Admitting: Cardiology

## 2021-07-16 DIAGNOSIS — R931 Abnormal findings on diagnostic imaging of heart and coronary circulation: Secondary | ICD-10-CM

## 2021-07-17 ENCOUNTER — Telehealth: Payer: Self-pay | Admitting: Interventional Cardiology

## 2021-07-17 ENCOUNTER — Ambulatory Visit: Payer: 59 | Attending: Internal Medicine | Admitting: Internal Medicine

## 2021-07-17 ENCOUNTER — Other Ambulatory Visit: Payer: Self-pay

## 2021-07-17 ENCOUNTER — Ambulatory Visit (HOSPITAL_COMMUNITY)
Admission: RE | Admit: 2021-07-17 | Discharge: 2021-07-17 | Disposition: A | Discharge status: Home / Self Care | Payer: 59 | Source: Ambulatory Visit | Attending: Cardiology | Admitting: Cardiology

## 2021-07-17 DIAGNOSIS — R931 Abnormal findings on diagnostic imaging of heart and coronary circulation: Secondary | ICD-10-CM | POA: Diagnosis not present

## 2021-07-17 DIAGNOSIS — I2510 Atherosclerotic heart disease of native coronary artery without angina pectoris: Secondary | ICD-10-CM | POA: Diagnosis not present

## 2021-07-17 NOTE — Telephone Encounter (Signed)
Pt is returning a call  

## 2021-07-17 NOTE — Telephone Encounter (Signed)
Left message to call back  

## 2021-07-18 NOTE — Telephone Encounter (Signed)
Pt returning phone call... please advise  

## 2021-07-19 ENCOUNTER — Telehealth: Payer: Self-pay | Admitting: Interventional Cardiology

## 2021-07-19 DIAGNOSIS — E782 Mixed hyperlipidemia: Secondary | ICD-10-CM

## 2021-07-19 DIAGNOSIS — Z79899 Other long term (current) drug therapy: Secondary | ICD-10-CM

## 2021-07-19 DIAGNOSIS — E118 Type 2 diabetes mellitus with unspecified complications: Secondary | ICD-10-CM

## 2021-07-19 DIAGNOSIS — R931 Abnormal findings on diagnostic imaging of heart and coronary circulation: Secondary | ICD-10-CM

## 2021-07-19 MED ORDER — ATORVASTATIN CALCIUM 40 MG PO TABS: 40.0000 mg | ORAL_TABLET | Freq: Every day | ORAL | 1 refills | Status: DC

## 2021-07-19 NOTE — Telephone Encounter (Signed)
Patient was returning call for results 

## 2021-07-19 NOTE — Telephone Encounter (Signed)
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  COntinue healthy diet, regular exercise. F/u with me in 1 year.  Would like to see LDL <100.  Increase atorvastatin to 40 mg daily.  Repeat liver and lipids in 3 months.   Pt made aware of Cardiac CT results and recommendations per Dr. Eldridge Dace. Confirmed the pharmacy of choice with the pt.  Scheduled the pt for repeat labs to check lipids and LFTs for 11/25.  She is aware to come fasting.  Pt verbalized understanding and agrees with this plan.

## 2021-07-20 ENCOUNTER — Ambulatory Visit: Payer: 59 | Admitting: Physical Therapy

## 2021-07-21 NOTE — Telephone Encounter (Signed)
LMTCB

## 2021-07-24 NOTE — Telephone Encounter (Signed)
Pt has not returned the call after multiple messages.  Will close this encounter and await a return call from the pt.

## 2021-07-25 ENCOUNTER — Ambulatory Visit: Payer: 59

## 2021-07-25 ENCOUNTER — Other Ambulatory Visit: Payer: Self-pay

## 2021-07-25 DIAGNOSIS — M25611 Stiffness of right shoulder, not elsewhere classified: Secondary | ICD-10-CM

## 2021-07-25 DIAGNOSIS — M7501 Adhesive capsulitis of right shoulder: Secondary | ICD-10-CM

## 2021-07-25 DIAGNOSIS — G8929 Other chronic pain: Secondary | ICD-10-CM

## 2021-07-25 DIAGNOSIS — M6281 Muscle weakness (generalized): Secondary | ICD-10-CM

## 2021-07-25 DIAGNOSIS — M25511 Pain in right shoulder: Secondary | ICD-10-CM | POA: Diagnosis not present

## 2021-07-25 DIAGNOSIS — R293 Abnormal posture: Secondary | ICD-10-CM

## 2021-07-25 NOTE — Therapy (Signed)
[facility] Outpatient Rehabilitation [facility] [address] [place], [place], [address] Phone: [phone]   Fax:  [phone]  Physical Therapy Treatment  Patient Details  Name: Charlotte Dickerson MRN: [id number] Date of Birth: 1961-04-24 Referring Provider (PT): Naiping M. Roda Shutters, MD   Encounter Date: 07/25/2021   PT End of Session - 07/25/21 0721     Visit Number 9    Number of Visits 13    Date for PT Re-Evaluation 08/19/21    Authorization Type [employer]    PT Start Time [phone]    PT Stop Time 0800    PT Time Calculation (min) 43 min    Activity Tolerance Patient tolerated treatment well    Behavior During Therapy [facility] for tasks assessed/performed             Past Medical History:  Diagnosis Date   Allergies    Arthritis    Diabetes (HCC)    HTN (hypertension)     Past Surgical History:  Procedure Laterality Date   ROTATOR CUFF REPAIR  06/2020    There were no vitals filed for this visit.   Subjective Assessment - 07/25/21 0727     Subjective Pt reports she has continued to make improvement with her R shoulder> She note shaving intermittent pain at a low level.    Diagnostic tests R shoulder XR: Arthritic changes to the [facility] joint.  Circular lucencies within the humeral   head likely from recent rotator cuff anchor placement    Patient Stated Goals I want to be able to use my arm and stretch it out    Currently in Pain? Yes    Pain Score 3     Pain Location Shoulder    Pain Orientation Right    Pain Descriptors / Indicators Aching    Pain Type Chronic pain    Pain Onset More than a month ago    Pain Frequency Intermittent                               [facility] Adult PT Treatment/Exercise - 07/25/21 0001       Exercises   Exercises Shoulder      Shoulder Exercises: Supine   Flexion 10 reps   2 sets   Flexion Limitations dowel press to overhead stretch with 2#      Shoulder Exercises: Standing   External Rotation 10  reps   3 sets   Theraband Level (Shoulder External Rotation) Level 1 (Yellow)    Internal Rotation 10 reps   3 sets   Theraband Level (Shoulder Internal Rotation) Level 1 (Yellow)    Flexion 10 reps   3 sets   Flexion Limitations UE Ranger      Manual Therapy   Joint Mobilization Right GHJ mobs primarily inferior and posterior at various ranges of elevation    Passive ROM Right shoulder all directions                      PT Short Term Goals - 07/10/21 1739       PT SHORT TERM GOAL #1   Title Pt will be I and compliant with initial HEP.    Baseline continues to report inconsistency with HEP - 07/10/21    Status On-going    Target Date 08/19/21      PT SHORT TERM GOAL #2   Title Pt will demonstrate functional IR to  at least L1 and ER to at least C7, in order to perform hygiene on low back and hair in the shower.    Baseline functional IR to L5 and functional ER to C7    Status On-going    Target Date 08/19/21               PT Long Term Goals - 06/17/21 1111       PT LONG TERM GOAL #1   Title Pt will demonstrate functional IR to at least T8 and ER to at least T1, in order to perform hygiene, grooming, don/doff bra and other clothing.    Baseline currently ER to glute and IR to occiput    Status On-going    Target Date 08/19/21      PT LONG TERM GOAL #2   Title Pt will increase flexion and abduction ROM to 145 or greater for symmetry with L UE    Status On-going    Target Date 08/19/21      PT LONG TERM GOAL #3   Title Pt will be able to sleep through the night without waking up secondary to R shoulder pain.    Status On-going    Target Date 08/19/21      PT LONG TERM GOAL #4   Title Pt will decrease R shoulder pain to </= 3/10 with all functional activity, including I/ADLs and work.    Baseline 8/10 at worst    Status On-going    Target Date 08/19/21      PT LONG TERM GOAL #5   Title Pt will increase FOTO ability to at least 66% ability, in order  to demonstrate meaningful change in perceived level of functional ability.    Baseline 51%    Status On-going    Target Date 08/19/21                   Plan - 07/25/21 0724     Clinical Impression Statement PT continued to work on R shoulder ROM and strength. ROM was addressed per mobs, PROM, and AROM exs. Strengthening primarily addressed the rotator cuff. Pt tolerated the session with some anticipated soreness. Pt will continue to benefit from PT to address ROM and strength deficits to optimize function of the R shoulder/UE.    Personal Factors and Comorbidities Profession;Time since onset of injury/illness/exacerbation;Comorbidity 3+    Comorbidities Arthritis, DM II, HTN    Examination-Activity Limitations Bathing;Lift;Hygiene/Grooming;Dressing;Sleep;Carry;Caring for Others;Reach Overhead    Examination-Participation Restrictions Cleaning;Meal Prep;Laundry;Community Activity    Stability/Clinical Decision Making Evolving/Moderate complexity    Clinical Decision Making Moderate    Rehab Potential Good    PT Frequency 1x / week    PT Duration 8 weeks    PT Treatment/Interventions ADLs/Self Care Home Management;Cryotherapy;Electrical Stimulation;Iontophoresis 4mg /ml Dexamethasone;Moist Heat;Functional mobility training;Therapeutic activities;Therapeutic exercise;Neuromuscular re-education;Patient/family education;Manual techniques;Passive range of motion;Dry needling;Taping;Vasopneumatic Device;Spinal Manipulations;Joint Manipulations    PT Next Visit Plan Assess response to HEP for ROM. Add strengthening exs. Ice/heat for pain management. Assess TPDN    PT Home Exercise Plan [id number]    Consulted and Agree with Plan of Care Patient             Patient will benefit from skilled therapeutic intervention in order to improve the following deficits and impairments:  Hypomobility, Decreased activity tolerance, Decreased range of motion, Decreased strength, Increased fascial  restricitons, Impaired flexibility, Postural dysfunction, Improper body mechanics, Impaired UE functional use, Impaired perceived functional ability, Pain  Visit Diagnosis: Chronic right shoulder  pain  Muscle weakness (generalized)  Stiffness of right shoulder, not elsewhere classified  Abnormal posture  Adhesive capsulitis of right shoulder     Problem List There are no problems to display for this patient.   Joellyn Rued MS, PT 07/25/21 8:21 AM   [facility] Outpatient Rehabilitation [facility] [address] [place], [place], [address] Phone: [phone]   Fax:  [phone]  Name: Charlotte Dickerson MRN: [id number] Date of Birth: 14-Dec-1960

## 2021-08-04 ENCOUNTER — Ambulatory Visit: Payer: 59 | Admitting: Orthopaedic Surgery

## 2021-08-08 ENCOUNTER — Ambulatory Visit: Payer: 59 | Attending: Orthopaedic Surgery

## 2021-08-08 ENCOUNTER — Other Ambulatory Visit: Payer: Self-pay

## 2021-08-08 DIAGNOSIS — M7501 Adhesive capsulitis of right shoulder: Secondary | ICD-10-CM | POA: Diagnosis present

## 2021-08-08 DIAGNOSIS — M25611 Stiffness of right shoulder, not elsewhere classified: Secondary | ICD-10-CM | POA: Insufficient documentation

## 2021-08-08 DIAGNOSIS — R293 Abnormal posture: Secondary | ICD-10-CM | POA: Insufficient documentation

## 2021-08-08 DIAGNOSIS — M25511 Pain in right shoulder: Secondary | ICD-10-CM | POA: Insufficient documentation

## 2021-08-08 DIAGNOSIS — G8929 Other chronic pain: Secondary | ICD-10-CM | POA: Diagnosis present

## 2021-08-08 DIAGNOSIS — M6281 Muscle weakness (generalized): Secondary | ICD-10-CM | POA: Diagnosis present

## 2021-08-08 NOTE — Therapy (Signed)
[facility] Outpatient Rehabilitation [facility] [address] [place], [place], [address] Phone: [phone]   Fax:  [phone]  Physical Therapy Treatment/Progrress Note  Patient Details  Name: Charlotte Dickerson MRN: [id number] Date of Birth: 11/10/1961 Referring Provider (PT): Naiping M. Roda Shutters, MD  Progress Note Reporting Period 04/21/21 to 08/08/21  See note below for Objective Data and Assessment of Progress/Goals.      Encounter Date: 08/08/2021   PT End of Session - 08/08/21 0727     Visit Number 10    Number of Visits 13    Date for PT Re-Evaluation 08/19/21    Authorization Type [employer]    PT Start Time 0720    PT Stop Time 0804    PT Time Calculation (min) 44 min    Activity Tolerance Patient tolerated treatment well    Behavior During Therapy [facility] for tasks assessed/performed             Past Medical History:  Diagnosis Date   Allergies    Arthritis    Diabetes (HCC)    HTN (hypertension)     Past Surgical History:  Procedure Laterality Date   ROTATOR CUFF REPAIR  06/2020    There were no vitals filed for this visit.   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 Pt reports overall 50% improvement in R shoulder pain and use since the start of PT.    Limitations House hold activities;Lifting    Diagnostic tests R shoulder XR: Arthritic changes to the [facility] joint.  Circular lucencies within the humeral   head likely from recent rotator cuff anchor placement    Patient Stated Goals I want to be able to use my arm and stretch it out    Currently in Pain? Yes    Pain Score 6     Pain Location Shoulder    Pain Orientation Right    Pain Descriptors / Indicators Aching    Pain Type Chronic pain    Pain Onset More than a month ago    Pain Frequency Intermittent    Aggravating Factors  Reaching behaind  back and raising arm                [facility] PT Assessment - 08/08/21 0001       PROM   Right Shoulder Flexion 140 Degrees                           [facility] Adult PT Treatment/Exercise - 08/08/21 0001       Exercises   Exercises Shoulder      Shoulder Exercises: ROM/Strengthening   UBE (Upper Arm Bike) 5 mins; L!; foreward and backward 2.5 mins.    Ranger 20x flexion and abd    Other ROM/Strengthening Exercises Wand assist in standing, flexion and abd, 10x, 10 sec hold    Other ROM/Strengthening Exercises Towel assist IR stretch 5x, 20 sec      Shoulder Exercises: Stretch   Corner Stretch 4 reps;20 seconds   Doorway, 90d     Manual Therapy   Joint Mobilization Right GHJ mobs primarily inferior and posterior at various ranges of elevation    Passive ROM Right shoulder all directions  PT Education - 08/08/21 0805     Education Details Reviewed FOTO reassessment. Education on the varie time time for shoulder ROM improving during the thawing stage of recover.    Person(s) Educated Patient    Methods Explanation    Comprehension Verbalized understanding              PT Short Term Goals - 07/10/21 1739       PT SHORT TERM GOAL #1   Title Pt will be I and compliant with initial HEP.    Baseline continues to report inconsistency with HEP - 07/10/21    Status On-going    Target Date 08/19/21      PT SHORT TERM GOAL #2   Title Pt will demonstrate functional IR to at least L1 and ER to at least C7, in order to perform hygiene on low back and hair in the shower.    Baseline functional IR to L5 and functional ER to C7    Status On-going    Target Date 08/19/21               PT Long Term Goals - 06/17/21 1111       PT LONG TERM GOAL #1   Title Pt will demonstrate functional IR to at least T8 and ER to at least T1, in order to perform hygiene, grooming, don/doff bra and other clothing.    Baseline currently ER to  glute and IR to occiput    Status On-going    Target Date 08/19/21      PT LONG TERM GOAL #2   Title Pt will increase flexion and abduction ROM to 145 or greater for symmetry with L UE    Status On-going    Target Date 08/19/21      PT LONG TERM GOAL #3   Title Pt will be able to sleep through the night without waking up secondary to R shoulder pain.    Status On-going    Target Date 08/19/21      PT LONG TERM GOAL #4   Title Pt will decrease R shoulder pain to </= 3/10 with all functional activity, including I/ADLs and work.    Baseline 8/10 at worst    Status On-going    Target Date 08/19/21      PT LONG TERM GOAL #5   Title Pt will increase FOTO ability to at least 66% ability, in order to demonstrate meaningful change in perceived level of functional ability.    Baseline 51%    Status On-going    Target Date 08/19/21                   Plan - 08/08/21 0731     Clinical Impression Statement PT focused today increasing R shoulder ROM per AROM, PROM, mobs. FOTO was reassessed with review. FOTO score had not improved, however pt is reporting 50% improvement in R shoulder pain and function. PROM of the L shoulder remains at 140d with a hard endfeel. Educated pt on the time frame for the thawing process for a frozen shoulder.    Personal Factors and Comorbidities Profession;Time since onset of injury/illness/exacerbation;Comorbidity 3+    Comorbidities Arthritis, DM II, HTN    Examination-Activity Limitations Bathing;Lift;Hygiene/Grooming;Dressing;Sleep;Carry;Caring for Others;Reach Overhead    Examination-Participation Restrictions Cleaning;Meal Prep;Laundry;Community Activity    Stability/Clinical Decision Making Evolving/Moderate complexity    Clinical Decision Making Moderate    Rehab Potential Good    PT Frequency 1x / week  PT Duration 8 weeks    PT Treatment/Interventions ADLs/Self Care Home Management;Cryotherapy;Electrical Stimulation;Iontophoresis 4mg /ml  Dexamethasone;Moist Heat;Functional mobility training;Therapeutic activities;Therapeutic exercise;Neuromuscular re-education;Patient/family education;Manual techniques;Passive range of motion;Dry needling;Taping;Vasopneumatic Device;Spinal Manipulations;Joint Manipulations    PT Next Visit Plan Review HEP. Reassess for continuation of PT vs. DC in light of a varied healing time frame for a frozen shoulder and pt's shoulder ROM being plateaued.    PT Home Exercise Plan [id number]    Consulted and Agree with Plan of Care Patient             Patient will benefit from skilled therapeutic intervention in order to improve the following deficits and impairments:  Hypomobility, Decreased activity tolerance, Decreased range of motion, Decreased strength, Increased fascial restricitons, Impaired flexibility, Postural dysfunction, Improper body mechanics, Impaired UE functional use, Impaired perceived functional ability, Pain  Visit Diagnosis: Chronic right shoulder pain  Muscle weakness (generalized)  Stiffness of right shoulder, not elsewhere classified  Abnormal posture  Adhesive capsulitis of right shoulder     Problem List There are no problems to display for this patient.   , PT 08/08/2021, 8:39 AM  [facility] [address] [place], [place], [place] Phone: [phone]   Fax:  [phone]  Name: Keyli Duross MRN: Jennefer Bravo Date of Birth: 1961-11-19

## 2021-08-18 ENCOUNTER — Ambulatory Visit: Payer: 59 | Admitting: Physical Therapy

## 2021-08-18 ENCOUNTER — Other Ambulatory Visit: Payer: Self-pay

## 2021-08-18 ENCOUNTER — Encounter: Payer: Self-pay | Admitting: Physical Therapy

## 2021-08-18 DIAGNOSIS — M7501 Adhesive capsulitis of right shoulder: Secondary | ICD-10-CM

## 2021-08-18 DIAGNOSIS — M25511 Pain in right shoulder: Secondary | ICD-10-CM | POA: Diagnosis not present

## 2021-08-18 DIAGNOSIS — M25611 Stiffness of right shoulder, not elsewhere classified: Secondary | ICD-10-CM

## 2021-08-18 DIAGNOSIS — M6281 Muscle weakness (generalized): Secondary | ICD-10-CM

## 2021-08-18 DIAGNOSIS — G8929 Other chronic pain: Secondary | ICD-10-CM

## 2021-08-18 DIAGNOSIS — R293 Abnormal posture: Secondary | ICD-10-CM

## 2021-08-18 NOTE — Therapy (Addendum)
[name], [name], [address] Phone: [phone]   Fax:  [phone]  Physical Therapy Treatment/Discharge  Patient Details  Name: Charlotte Dickerson MRN: [id number] Date of Birth: [birth year] Referring Provider (PT): Naiping M. Erlinda Hong, MD   Encounter Date: 08/18/2021   PT End of Session - 08/18/21 0724     Visit Number 11    Number of Visits 13    Date for PT Re-Evaluation 08/19/21    Authorization Type [employer]    PT Start Time 0715    PT Stop Time 0800    PT Time Calculation (min) 45 min             Past Medical History:  Diagnosis Date   Allergies    Arthritis    Diabetes ([place])    HTN (hypertension)     Past Surgical History:  Procedure Laterality Date   ROTATOR CUFF REPAIR  06/2020    There were no vitals filed for this visit.   Subjective Assessment - 08/18/21 0724     Subjective Pt reports pain is 3-4/10, tenderness and soreness.    Currently in Pain? Yes    Pain Score 4     Pain Location Shoulder    Pain Orientation Right    Pain Descriptors / Indicators Tender;Sore    Pain Type Chronic pain    Aggravating Factors  reaching behind back and raising arm    Pain Relieving Factors avoid motions                [facility] PT Assessment - 08/18/21 0001       Observation/Other Assessments   Focus on Therapeutic Outcomes (FOTO)  63%      AROM   Right Shoulder Flexion 123 Degrees    Right Shoulder ABduction 110 Degrees    Right Shoulder Internal Rotation --   reach to L2   Right Shoulder External Rotation --   Reach T2     PROM   Right Shoulder Flexion 140 Degrees    Right Shoulder ABduction 130 Degrees    Right Shoulder Internal Rotation 75 Degrees    Right Shoulder External Rotation 70 Degrees                           [facility] Adult PT Treatment/Exercise - 08/18/21 0001       Shoulder Exercises: Standing   External Rotation 20 reps    Internal  Rotation 20 reps    Row 20 reps    Theraband Level (Shoulder Row) Level 3 (Green)    Other Standing Exercises Standing counter step-back stretch 5 x 5 sec    Other Standing Exercises cabinet reaching with cone to middle shelf then with 1# x 10 each      Shoulder Exercises: ROM/Strengthening   UBE (Upper Arm Bike) 5 mins; L2; foreward and backward 2.5 mins.    Ranger 20x flexion and abd   wall slide with pullow case today   Other ROM/Strengthening Exercises Towel assist IR stretch 5x, 20 sec      Shoulder Exercises: Stretch   Corner Stretch 4 reps;20 seconds   Doorway, 90d   Corner Stretch Limitations bilateral and single arm variations      Manual Therapy   Joint Mobilization Right GHJ mobs primarily inferior and posterior at various ranges of elevation    Passive ROM Right shoulder all directions  PT Short Term Goals - 08/18/21 7867       PT SHORT TERM GOAL #1   Title Pt will be I and compliant with initial HEP.    Baseline performing regularly but not every day    Time 3    Period Weeks    Status Achieved    Target Date 08/19/21      PT SHORT TERM GOAL #2   Title Pt will demonstrate functional IR to at least L1 and ER to at least C7, in order to perform hygiene on low back and hair in the shower.    Time 4    Period Weeks    Status Achieved    Target Date 08/19/21               PT Long Term Goals - 08/18/21 0808       PT LONG TERM GOAL #1   Title Pt will demonstrate functional IR to at least T8 and ER to at least T1, in order to perform hygiene, grooming, don/doff bra and other clothing.    Time 8    Period Weeks    Status Partially Met      PT LONG TERM GOAL #2   Title Pt will increase flexion and abduction ROM to 145 or greater for symmetry with L UE    Baseline partially met for PROM    Time 8    Period Weeks    Status Partially Met      PT LONG TERM GOAL #3   Title Pt will be able to sleep through the night  without waking up secondary to R shoulder pain.    Time 8    Period Weeks    Status Achieved      PT LONG TERM GOAL #4   Title Pt will decrease R shoulder pain to </= 3/10 with all functional activity, including I/ADLs and work.    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    Baseline 51% improved to 63%    Time 8    Period Weeks    Status Partially Met                   Plan - 08/18/21 0803     Clinical Impression Statement Pt reports limited compliance with HEP. Her FOTO score and ROM have improved. She no longer has difficulty sleeping due to shoulder pain. 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 Today she is reaching into overhead cabinets with difficulty. Her sorenss and fatigues increases with her ROM and strengthening exercises. Her HEP was reviewed.  Pt has met or partially met all STGs and LTGs. Pt is agreeable to discharge today to work on her HEP and perhaps retrun in the future if needed.    PT Treatment/Interventions ADLs/Self Care Home Management;Cryotherapy;Electrical Stimulation;Iontophoresis 56m/ml Dexamethasone;Moist Heat;Functional mobility training;Therapeutic activities;Therapeutic exercise;Neuromuscular re-education;Patient/family education;Manual techniques;Passive range of motion;Dry needling;Taping;Vasopneumatic Device;Spinal Manipulations;Joint Manipulations    PT Next Visit Plan discharge to [place]  Consulted and Agree with Plan of Care Patient             Patient will benefit from skilled therapeutic intervention in order to improve the following deficits  and impairments:  Hypomobility, Decreased activity tolerance, Decreased range of motion, Decreased strength, Increased fascial restricitons, Impaired flexibility, Postural dysfunction, Improper body mechanics, Impaired UE functional use, Impaired perceived functional ability, Pain  Visit Diagnosis: Chronic right shoulder pain  Muscle weakness (generalized)  Stiffness of right shoulder, not elsewhere classified  Abnormal posture  Adhesive capsulitis of right shoulder     Problem List There are no problems to display for this patient.   Hessie Diener Chacra, PTA 08/18/2021, 8:21 AM  PHYSICAL THERAPY DISCHARGE SUMMARY  Visits from Start of Care: 4  Current functional level related to goals / functional outcomes: See above   Remaining deficits: See above   Education / Equipment: HEP   Patient agrees to discharge. Patient goals were partially met. Patient is being discharged due to not returning since the last visit.  Gar Ponto MS, PT 12/29/21 7:57 AM    [facility] [address] [place], [name], [address] Phone: [phone]   Fax:  [phone]  Name: Charlotte Dickerson MRN: [id number] Date of Birth: 03/04/1961

## 2021-08-23 ENCOUNTER — Ambulatory Visit: Payer: 59 | Admitting: Family Medicine

## 2021-08-29 IMAGING — CT CT HEART MORP W/ CTA COR W/ SCORE W/ CA W/CM &/OR W/O CM
4 of 7 series · 8 of 20 positions shown, 9 images · non-contrast
Comparison: None.

Addendum:
CLINICAL DATA: 60F with chest pain

EXAM:
Cardiac/Coronary CTA
TECHNIQUE: The patient was scanned on a [employer] Force scanner.

[Series 6: best diast 73 % · axial · 0.39mm/px · z∈[+1137,+1174]mm · 2 of 283 slices shown]
[im 95/283  vessel]
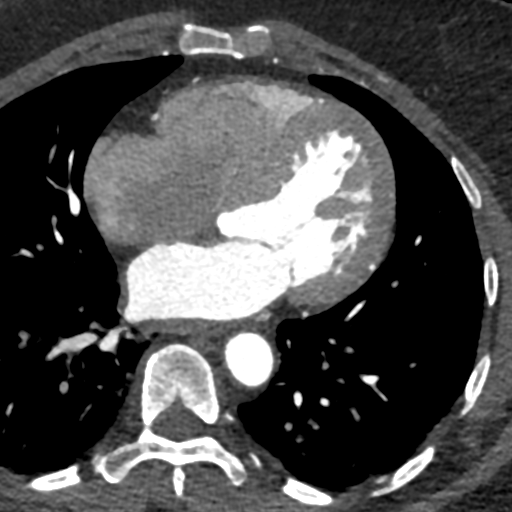
[im 189/283  vessel]
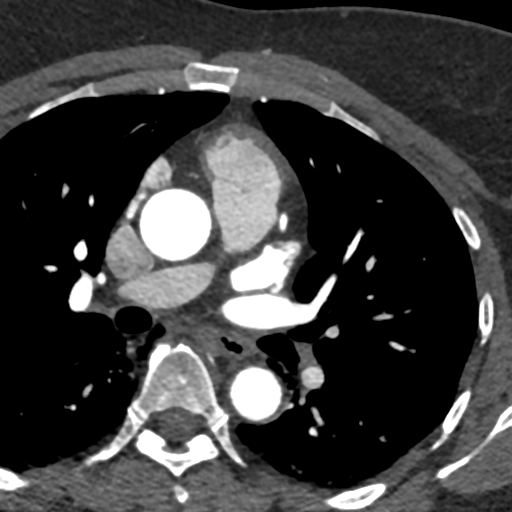

[Series 7: best syst · axial · 0.39mm/px · z∈[+1137,+1174]mm · 2 of 283 slices shown, 3 images]
[im 95/283  vessel]
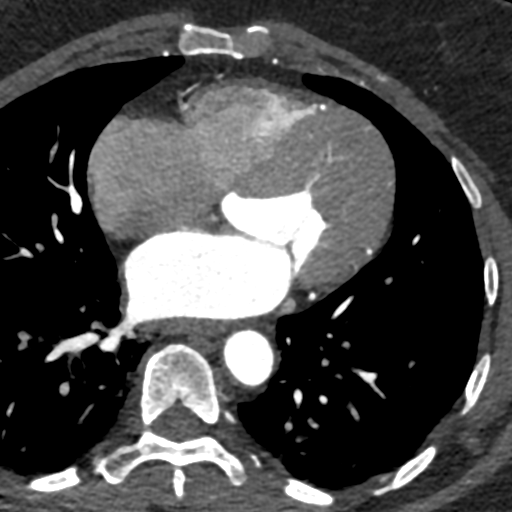
[im 95/283  lung]
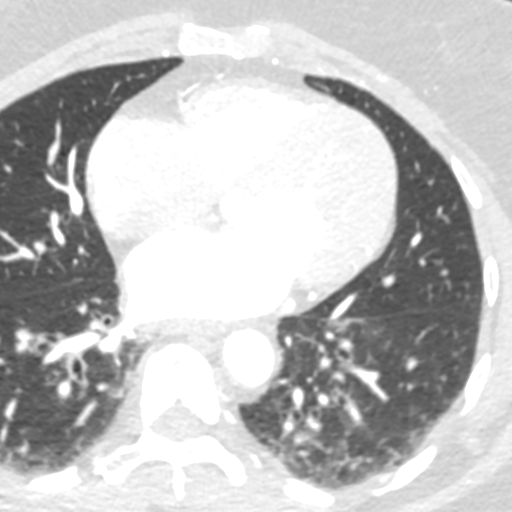
[im 189/283  vessel]
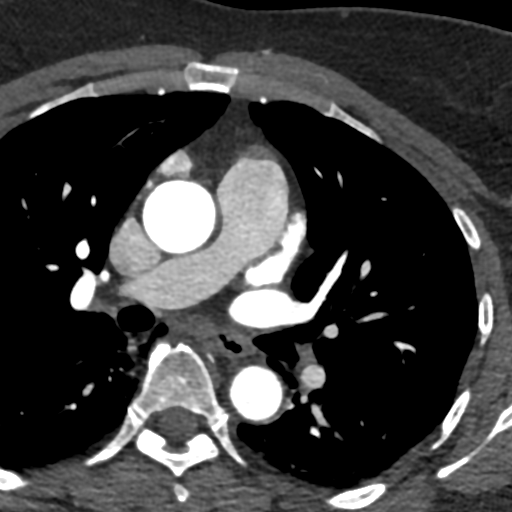

[Series 8: ts diast sharp · axial · 0.39mm/px · z∈[+1137,+1174]mm · 2 of 283 slices shown]
[im 95/283  lung]
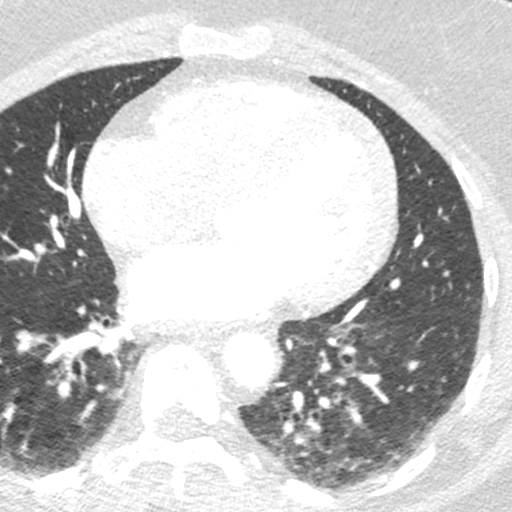
[im 189/283  lung]
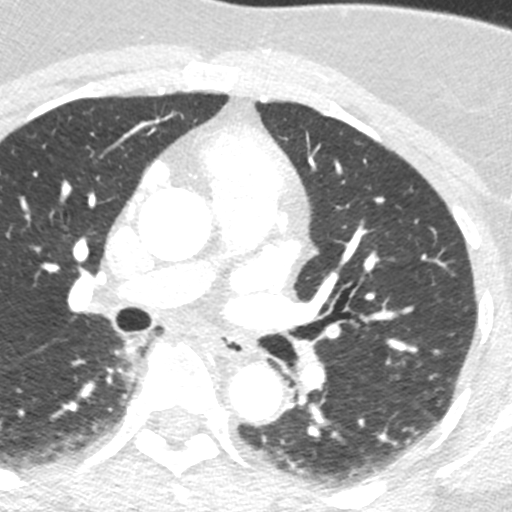

[Series 9: ts syst sharp 37 % · axial · 0.39mm/px · z∈[+1137,+1174]mm · 2 of 283 slices shown]
[im 95/283  lung]
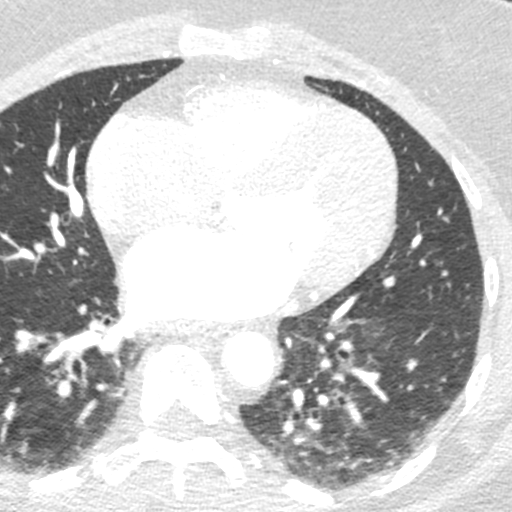
[im 189/283  lung]
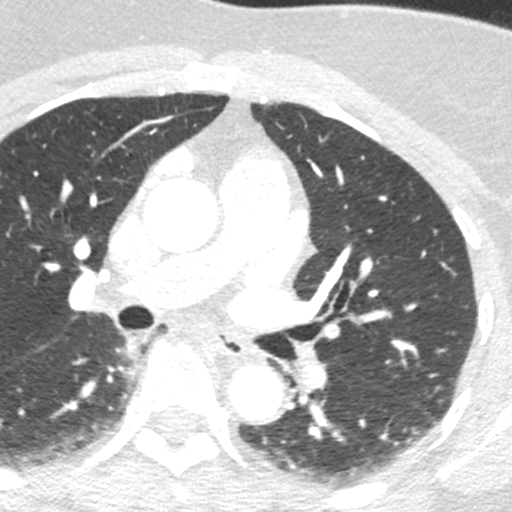

[8 of 20 positions shown; findings below may reference images not displayed]

FINDINGS: A 100 kV prospective scan was triggered in the descending thoracic
aorta at 111 HU's. Axial non-contrast 3 mm slices were carried out
through the heart. The data set was analyzed on a dedicated work
station and scored using the Agatson method. Gantry rotation speed
was 250 msecs and collimation was .6 mm. No beta blockade and 0.8 mg
of sl NTG was given. The 3D data set was reconstructed in 5%
intervals of the 35-75 % of the R-R cycle. Diastolic and systolic
phases were analyzed on a dedicated work station using MPR, MIP and
VRT modes. The patient received 80 cc of contrast.

Coronary Arteries:  Normal coronary origin.  Codominance.

RCA is a codominant artery that gives rise to PDA and PLA. There is
no plaque.

Left main is a large artery that gives rise to LAD and LCX arteries.
There is calcified plaque in the distal left main causing 0-24%
stenosis.

LAD is a large vessel. There is calcified plaque in the proximal LAD
causing 0-24% stenosis

LCX is a codominant artery that gives rise to one large OM1 branch.
There is calcified plaque in the proximal LCX causing 0-24%
stenosis. There is calcified plaque in the mid LCX causing 50-69%
stenosis

Other findings:

Left Ventricle: Normal size

Left Atrium: Normal size

Pulmonary Veins: Normal configuration

Right Ventricle: Normal size

Right Atrium: Normal size

Cardiac valves: Mild AV calcifications

Thoracic aorta: Normal size

Pulmonary Arteries: Normal size

Systemic Veins: Normal drainage

Pericardium: Normal thickness
IMPRESSION: 1. Coronary calcium score of 177. This was 95th percentile for age
and sex matched control.

2. Normal coronary origin with codominance.

3. Calcified plaque in the mid LCX causes moderate (50-69%)
stenosis. Will send for CTFFR

4. Calcified plaque in the distal left main, proximal LAD, and
proximal LCX causes minimal (0-24%) stenosis

CAD-RADS 3. Moderate stenosis. Consider symptom-guided anti-ischemic
pharmacotherapy as well as risk factor modification per guideline
directed care. Additional analysis with CT FFR will be submitted.

EXAM:
OVER-READ INTERPRETATION  CT CHEST

The following report is an over-read performed by radiologist Dr.
over-read does not include interpretation of cardiac or coronary
anatomy or pathology. The coronary calcium score and cardiac CTA
interpretation by the cardiologist is attached.
FINDINGS: Aortic atherosclerosis. Within the visualized portions of the thorax
there are no suspicious appearing pulmonary nodules or masses, there
is no acute consolidative airspace disease, no pleural effusions, no
pneumothorax and no lymphadenopathy. Visualized portions of the
upper abdomen are unremarkable. There are no aggressive appearing
lytic or blastic lesions noted in the visualized portions of the
skeleton.
IMPRESSION: 1.  Aortic Atherosclerosis ([id number]).

*** End of Addendum ***
FINDINGS: A 100 kV prospective scan was triggered in the descending thoracic
aorta at 111 HU's. Axial non-contrast 3 mm slices were carried out
through the heart. The data set was analyzed on a dedicated work
station and scored using the Agatson method. Gantry rotation speed
was 250 msecs and collimation was .6 mm. No beta blockade and 0.8 mg
of sl NTG was given. The 3D data set was reconstructed in 5%
intervals of the 35-75 % of the R-R cycle. Diastolic and systolic
phases were analyzed on a dedicated work station using MPR, MIP and
VRT modes. The patient received 80 cc of contrast.

Coronary Arteries:  Normal coronary origin.  Codominance.

RCA is a codominant artery that gives rise to PDA and PLA. There is
no plaque.

Left main is a large artery that gives rise to LAD and LCX arteries.
There is calcified plaque in the distal left main causing 0-24%
stenosis.

LAD is a large vessel. There is calcified plaque in the proximal LAD
causing 0-24% stenosis

LCX is a codominant artery that gives rise to one large OM1 branch.
There is calcified plaque in the proximal LCX causing 0-24%
stenosis. There is calcified plaque in the mid LCX causing 50-69%
stenosis

Other findings:

Left Ventricle: Normal size

Left Atrium: Normal size

Pulmonary Veins: Normal configuration

Right Ventricle: Normal size

Right Atrium: Normal size

Cardiac valves: Mild AV calcifications

Thoracic aorta: Normal size

Pulmonary Arteries: Normal size

Systemic Veins: Normal drainage

Pericardium: Normal thickness
IMPRESSION: 1. Coronary calcium score of 177. This was 95th percentile for age
and sex matched control.

2. Normal coronary origin with codominance.

3. Calcified plaque in the mid LCX causes moderate (50-69%)
stenosis. Will send for CTFFR

4. Calcified plaque in the distal left main, proximal LAD, and
proximal LCX causes minimal (0-24%) stenosis

CAD-RADS 3. Moderate stenosis. Consider symptom-guided anti-ischemic
pharmacotherapy as well as risk factor modification per guideline
directed care. Additional analysis with CT FFR will be submitted.

## 2021-09-22 ENCOUNTER — Ambulatory Visit (INDEPENDENT_AMBULATORY_CARE_PROVIDER_SITE_OTHER): Payer: 59 | Admitting: Registered Nurse

## 2021-09-22 ENCOUNTER — Other Ambulatory Visit: Payer: Self-pay

## 2021-09-22 ENCOUNTER — Encounter: Payer: Self-pay | Admitting: Registered Nurse

## 2021-09-22 VITALS — BP 112/68 | HR 75 | Temp 98.2°F | Resp 18 | Ht 65.0 in | Wt 154.2 lb

## 2021-09-22 DIAGNOSIS — Z1231 Encounter for screening mammogram for malignant neoplasm of breast: Secondary | ICD-10-CM

## 2021-09-22 DIAGNOSIS — E119 Type 2 diabetes mellitus without complications: Secondary | ICD-10-CM

## 2021-09-22 DIAGNOSIS — R931 Abnormal findings on diagnostic imaging of heart and coronary circulation: Secondary | ICD-10-CM

## 2021-09-22 DIAGNOSIS — E118 Type 2 diabetes mellitus with unspecified complications: Secondary | ICD-10-CM

## 2021-09-22 DIAGNOSIS — Z79899 Other long term (current) drug therapy: Secondary | ICD-10-CM | POA: Diagnosis not present

## 2021-09-22 DIAGNOSIS — Z5320 Procedure and treatment not carried out because of patient's decision for unspecified reasons: Secondary | ICD-10-CM | POA: Diagnosis not present

## 2021-09-22 DIAGNOSIS — L299 Pruritus, unspecified: Secondary | ICD-10-CM

## 2021-09-22 DIAGNOSIS — E782 Mixed hyperlipidemia: Secondary | ICD-10-CM

## 2021-09-22 LAB — LIPID PANEL
Cholesterol: 135 mg/dL (ref 0–200)
HDL: 37.4 mg/dL — ABNORMAL LOW (ref >39.00)
LDL Cholesterol: 75 mg/dL (ref 0–99)
NonHDL: 97.61
Total CHOL/HDL Ratio: 4
Triglycerides: 112 mg/dL (ref 0.0–149.0)
VLDL: 22.4 mg/dL (ref 0.0–40.0)

## 2021-09-22 LAB — CBC WITH DIFFERENTIAL/PLATELET
Basophils Absolute: 0.1 10*3/uL (ref 0.0–0.1)
Basophils Relative: 0.9 % (ref 0.0–3.0)
Eosinophils Absolute: 0.1 10*3/uL (ref 0.0–0.7)
Eosinophils Relative: 1.3 % (ref 0.0–5.0)
HCT: 38.7 % (ref 36.0–46.0)
Hemoglobin: 12.5 g/dL (ref 12.0–15.0)
Lymphocytes Relative: 37.3 % (ref 12.0–46.0)
Lymphs Abs: 2.4 10*3/uL (ref 0.7–4.0)
MCHC: 32.4 g/dL (ref 30.0–36.0)
MCV: 79.6 fl (ref 78.0–100.0)
Monocytes Absolute: 0.5 10*3/uL (ref 0.1–1.0)
Monocytes Relative: 7.2 % (ref 3.0–12.0)
Neutro Abs: 3.4 10*3/uL (ref 1.4–7.7)
Neutrophils Relative %: 53.3 % (ref 43.0–77.0)
Platelets: 313 10*3/uL (ref 150.0–400.0)
RBC: 4.86 Mil/uL (ref 3.87–5.11)
RDW: 14.4 % (ref 11.5–15.5)
WBC: 6.3 10*3/uL (ref 4.0–10.5)

## 2021-09-22 LAB — COMPREHENSIVE METABOLIC PANEL
ALT: 20 U/L (ref 0–35)
AST: 18 U/L (ref 0–37)
Albumin: 4.8 g/dL (ref 3.5–5.2)
Alkaline Phosphatase: 90 U/L (ref 39–117)
BUN: 13 mg/dL (ref 6–23)
CO2: 28 mEq/L (ref 19–32)
Calcium: 10.2 mg/dL (ref 8.4–10.5)
Chloride: 105 mEq/L (ref 96–112)
Creatinine, Ser: 0.76 mg/dL (ref 0.40–1.20)
GFR: 85.9 mL/min (ref >60.00)
Glucose, Bld: 182 mg/dL — ABNORMAL HIGH (ref 70–99)
Potassium: 4.9 mEq/L (ref 3.5–5.1)
Sodium: 141 mEq/L (ref 135–145)
Total Bilirubin: 1.3 mg/dL — ABNORMAL HIGH (ref 0.2–1.2)
Total Protein: 7.3 g/dL (ref 6.0–8.3)

## 2021-09-22 LAB — TSH: TSH: 0.53 u[IU]/mL (ref 0.35–5.50)

## 2021-09-22 LAB — POCT GLYCOSYLATED HEMOGLOBIN (HGB A1C): Hemoglobin A1C: 8 % — AB (ref 4.0–5.6)

## 2021-09-22 LAB — GLUCOSE, POCT (MANUAL RESULT ENTRY): POC Glucose: 210 mg/dl — AB (ref 70–99)

## 2021-09-22 MED ORDER — CLINDAMYCIN PHOS-BENZOYL PEROX 1-5 % EX GEL: Freq: Two times a day (BID) | CUTANEOUS | 0 refills | Status: DC

## 2021-09-22 MED ORDER — ATORVASTATIN CALCIUM 40 MG PO TABS: 40.0000 mg | ORAL_TABLET | Freq: Every day | ORAL | 0 refills | Status: DC

## 2021-09-22 MED ORDER — GLIPIZIDE 10 MG PO TABS: 10.0000 mg | ORAL_TABLET | Freq: Every day | ORAL | 0 refills | Status: DC

## 2021-09-22 MED ORDER — METFORMIN HCL 1000 MG PO TABS: 1000.0000 mg | ORAL_TABLET | Freq: Two times a day (BID) | ORAL | 0 refills | Status: DC

## 2021-09-22 NOTE — Progress Notes (Signed)
Established Patient Office Visit  Subjective:  Patient ID: Charlotte Dickerson, female    DOB: February 15, 1961  Age: 60 y.o. MRN: [id number]  CC:  Chief Complaint  Patient presents with   Medication Reaction    Patient states she is having a medication reaction that is causing irritation to her face. Patient wants all labs and glucose checked.    HPI Charlotte Dickerson presents for recheck on sugars  Borderline at home checks Intermittent fasting has been working for her.  Lab Results  Component Value Date   HGBA1C 8.0 (A) 09/22/2021  Still taking glipizde and metformin.  Hypertension: Patient Currently taking: amlodipine 12m po qd Good effect. No AEs. Denies CV symptoms including: chest pain, shob, doe, headache, visual changes, fatigue, claudication, and dependent edema.   Previous readings and labs: BP Readings from Last 3 Encounters:  09/22/21 112/68  07/14/21 125/62  05/22/21 110/70   Lab Results  Component Value Date   CREATININE 1.08 (H) 07/10/2021    Hld assoc with t2dm Taking atorvastatin 415mpo qd Good effect, no AE Lab Results  Component Value Date   CHOL 166 03/20/2021   HDL 36.50 (L) 03/20/2021   LDLDIRECT 109.0 03/20/2021   TRIG 204.0 (H) 03/20/2021   CHOLHDL 5 03/20/2021    Itching on face Ongoing for a few weeks, mostly around jaw line Some itching and dry skin No vesicles or skin breakdown.   Past Medical History:  Diagnosis Date   Allergies    Arthritis    Diabetes ([place]   HTN (hypertension)     Past Surgical History:  Procedure Laterality Date   ROTATOR CUFF REPAIR  06/2020    Family History  Problem Relation Age of Onset   Diabetes Mother    Heart attack Mother    Hypertension Mother    Stroke Mother    Depression Sister     Social History   Socioeconomic History   Marital status: Single    Spouse name: Not on file   Number of children: Not on file   Years of education: Not on file   Highest education level: Not on file   Occupational History   Not on file  Tobacco Use   Smoking status: Former    Types: Cigarettes   Smokeless tobacco: Never  Substance and Sexual Activity   Alcohol use: Not Currently    Alcohol/week: 3.0 - 4.0 standard drinks    Types: 3 - 4 Glasses of wine per week   Drug use: Never   Sexual activity: Not Currently  Other Topics Concern   Not on file  Social History Narrative   Not on file   Social Determinants of Health   Financial Resource Strain: Not on file  Food Insecurity: Not on file  Transportation Needs: Not on file  Physical Activity: Not on file  Stress: Not on file  Social Connections: Not on file  Intimate Partner Violence: Not on file    Outpatient Medications Prior to Visit  Medication Sig Dispense Refill   amLODipine (NORVASC) 10 MG tablet Take 1 tablet (10 mg total) by mouth daily. 90 tablet 1   glucose blood (TRUE METRIX PRO BLOOD GLUCOSE) test strip Use as instructed 1- 3 times daily to monitor blood glucose. 100 each 12   atorvastatin (LIPITOR) 40 MG tablet Take 1 tablet (40 mg total) by mouth at bedtime. 90 tablet 1   glipiZIDE (GLUCOTROL) 10 MG tablet Take 1 tablet (10 mg total) by mouth daily before  breakfast. 90 tablet 1   metFORMIN (GLUCOPHAGE) 1000 MG tablet Take 1 tablet (1,000 mg total) by mouth 2 (two) times daily with a meal. 180 tablet 1   metoprolol tartrate (LOPRESSOR) 100 MG tablet Take one tablet by mouth 2 hours prior to CT Scan 1 tablet 0   No facility-administered medications prior to visit.    No Known Allergies  ROS Review of Systems    Objective:    Physical Exam  BP 112/68   Pulse 75   Temp 98.2 F (36.8 C) (Temporal)   Resp 18   Ht _0  (1.651 m)   Wt 154 lb 3.2 oz (69.9 kg)   SpO2 99%   BMI 25.66 kg/m  Wt Readings from Last 3 Encounters:  09/22/21 154 lb 3.2 oz (69.9 kg)  05/22/21 156 lb (70.8 kg)  03/31/21 158 lb (71.7 kg)     Health Maintenance Due  Topic Date Due   Pneumococcal Vaccine 19-64 Years  old (1 - PCV) Never done   URINE MICROALBUMIN  Never done   HIV Screening  Never done   Hepatitis C Screening  Never done   TETANUS/TDAP  Never done   Zoster Vaccines- Shingrix (1 of 2) Never done   PAP SMEAR-Modifier  Never done   COLONOSCOPY (Pts 45-26yr Insurance coverage will need to be confirmed)  Never done   MAMMOGRAM  Never done   COVID-19 Vaccine (2 - Moderna risk series) 02/08/2020   INFLUENZA VACCINE  Never done    There are no preventive care reminders to display for this patient.  Lab Results  Component Value Date   TSH 0.78 03/20/2021   Lab Results  Component Value Date   WBC 8.2 03/20/2021   HGB 13.4 03/20/2021   HCT 40.0 03/20/2021   MCV 78.2 03/20/2021   PLT 334.0 03/20/2021   Lab Results  Component Value Date   NA 142 07/10/2021   K 4.6 07/10/2021   CO2 24 07/10/2021   GLUCOSE 201 (H) 07/10/2021   BUN 16 07/10/2021   CREATININE 1.08 (H) 07/10/2021   BILITOT 0.7 03/20/2021   ALKPHOS 101 03/20/2021   AST 22 03/20/2021   ALT 25 03/20/2021   PROT 7.8 03/20/2021   ALBUMIN 4.7 03/20/2021   CALCIUM 10.1 07/10/2021   EGFR 60 07/10/2021   GFR 77.56 03/20/2021   Lab Results  Component Value Date   CHOL 166 03/20/2021   Lab Results  Component Value Date   HDL 36.50 (L) 03/20/2021   No results found for: [facility] Results  Component Value Date   TRIG 204.0 (H) 03/20/2021   Lab Results  Component Value Date   CHOLHDL 5 03/20/2021   Lab Results  Component Value Date   HGBA1C 8.0 (A) 09/22/2021      Assessment & Plan:   Problem List Items Addressed This Visit   None Visit Diagnoses     Type 2 diabetes mellitus without complication, without long-term current use of insulin (HCC)    -  Primary   Relevant Medications   atorvastatin (LIPITOR) 40 MG tablet   glipiZIDE (GLUCOTROL) 10 MG tablet   metFORMIN (GLUCOPHAGE) 1000 MG tablet   Other Relevant Orders   POCT glucose (manual entry) (Completed)   Microalbumin / creatinine urine ratio    POCT HgB A1C (Completed)   CBC with Differential/Platelet   Comprehensive metabolic panel   Lipid panel   TSH   Papanicolaou smear declined       Relevant Orders  Ambulatory referral to Gynecology   Screening mammogram for breast cancer       Relevant Orders   MM Digital Screening   Medication management       Relevant Medications   atorvastatin (LIPITOR) 40 MG tablet   Abnormal findings diagnostic imaging of heart and coronary circulation       Relevant Medications   atorvastatin (LIPITOR) 40 MG tablet   Mixed hyperlipidemia       Relevant Medications   atorvastatin (LIPITOR) 40 MG tablet   Type 2 diabetes mellitus with complication, without long-term current use of insulin (HCC)       Relevant Medications   atorvastatin (LIPITOR) 40 MG tablet   glipiZIDE (GLUCOTROL) 10 MG tablet   metFORMIN (GLUCOPHAGE) 1000 MG tablet       Meds ordered this encounter  Medications   atorvastatin (LIPITOR) 40 MG tablet    Sig: Take 1 tablet (40 mg total) by mouth at bedtime.    Dispense:  90 tablet    Refill:  0    Dose increase    Order Specific Question:   Supervising Provider    Answer:   Carlota Raspberry, JEFFREY R [[id number]]   glipiZIDE (GLUCOTROL) 10 MG tablet    Sig: Take 1 tablet (10 mg total) by mouth daily before breakfast.    Dispense:  90 tablet    Refill:  0    Order Specific Question:   Supervising Provider    Answer:   Carlota Raspberry, JEFFREY R [[id number]]   metFORMIN (GLUCOPHAGE) 1000 MG tablet    Sig: Take 1 tablet (1,000 mg total) by mouth 2 (two) times daily with a meal.    Dispense:  180 tablet    Refill:  0    Order Specific Question:   Supervising Provider    Answer:   Carlota Raspberry, JEFFREY R [[id number]]    Follow-up: Return in about 3 months (around 12/23/2021) for t2dm.   PLAN A1c today at 8.0 - improved. She will continue aggressive lifestyle management and we will recheck in 3 mo She will return for retinal screen on 10/27/21 Labs collected. Will follow up with the patient as  warranted. Try benzaclin for face  BP well controlled. Stop amlodipine in case this is contributing to itching. Continue checking home BP. Patient encouraged to call clinic with any questions, comments, or concerns.  Maximiano Coss, NP

## 2021-09-22 NOTE — Patient Instructions (Addendum)
Charlotte Dickerson -   Charlotte Dickerson to see you.  Sugars improved - A1c at 8.0 Let's recheck these in 3 months. Great work  Stop amlodipine in case it is contributing to itching. Keep checking BP at home.  I will let you know how labs look  Come back on 10/27/21 for a retinal screen.   Let me know if you need anything!  Thanks,  Charlotte Dickerson    If you have lab work done today you will be contacted with your lab results within the next 2 weeks.  If you have not heard from [place] then please contact us. The fastest way to get your results is to register for My Chart.   IF you received an x-ray today, you will receive an invoice from [facility]. Please contact [facility] at [phone] with questions or concerns regarding your invoice.   IF you received labwork today, you will receive an invoice from [facility]. Please contact [facility] at [phone] with questions or concerns regarding your invoice.   Our billing staff will not be able to assist you with questions regarding bills from these companies.  You will be contacted with the lab results as soon as they are available. The fastest way to get your results is to activate your My Chart account. Instructions are located on the last page of this paperwork. If you have not heard from [place] regarding the results in 2 weeks, please contact this office.

## 2021-09-29 ENCOUNTER — Other Ambulatory Visit: Payer: Self-pay | Admitting: Registered Nurse

## 2021-09-29 DIAGNOSIS — E119 Type 2 diabetes mellitus without complications: Secondary | ICD-10-CM

## 2021-09-29 DIAGNOSIS — I10 Essential (primary) hypertension: Secondary | ICD-10-CM

## 2021-10-05 ENCOUNTER — Other Ambulatory Visit: Payer: Self-pay | Admitting: Registered Nurse

## 2021-10-05 ENCOUNTER — Telehealth: Payer: Self-pay

## 2021-10-05 DIAGNOSIS — L299 Pruritus, unspecified: Secondary | ICD-10-CM

## 2021-10-05 MED ORDER — CLINDAMYCIN PHOS-BENZOYL PEROX 1-5 % EX GEL: Freq: Two times a day (BID) | CUTANEOUS | 0 refills | Status: DC

## 2021-10-05 NOTE — Telephone Encounter (Signed)
Have reordered under a dx code  Thank you  Luan Pulling

## 2021-10-05 NOTE — Telephone Encounter (Signed)
[name], one of the patients medications need a PA and there are no problems listed in patient chart for me to retrieve a diagnosis code. The medication is Clindamycin gel.

## 2021-10-06 DIAGNOSIS — L299 Pruritus, unspecified: Secondary | ICD-10-CM | POA: Insufficient documentation

## 2021-10-06 NOTE — Telephone Encounter (Signed)
Noted  

## 2021-10-09 ENCOUNTER — Ambulatory Visit: Payer: 59 | Admitting: Family Medicine

## 2021-10-20 ENCOUNTER — Other Ambulatory Visit: Payer: 59

## 2021-11-03 ENCOUNTER — Other Ambulatory Visit: Payer: Self-pay

## 2021-11-03 ENCOUNTER — Other Ambulatory Visit (HOSPITAL_COMMUNITY)
Admission: RE | Admit: 2021-11-03 | Discharge: 2021-11-03 | Disposition: A | Discharge status: Home / Self Care | Payer: 59 | Source: Ambulatory Visit

## 2021-11-03 ENCOUNTER — Ambulatory Visit (INDEPENDENT_AMBULATORY_CARE_PROVIDER_SITE_OTHER): Payer: 59

## 2021-11-03 VITALS — BP 147/76 | HR 76 | Temp 98.0°F | Ht 65.0 in | Wt 155.8 lb

## 2021-11-03 DIAGNOSIS — Z124 Encounter for screening for malignant neoplasm of cervix: Secondary | ICD-10-CM | POA: Insufficient documentation

## 2021-11-03 DIAGNOSIS — Z1239 Encounter for other screening for malignant neoplasm of breast: Secondary | ICD-10-CM

## 2021-11-03 DIAGNOSIS — Z01419 Encounter for gynecological examination (general) (routine) without abnormal findings: Secondary | ICD-10-CM | POA: Diagnosis present

## 2021-11-03 NOTE — Progress Notes (Signed)
GYNECOLOGY OFFICE VISIT NOTE-WELL WOMAN EXAM  History:   Charlotte Dickerson [id number] here today for annual exam. She denies any abnormal vaginal discharge, bleeding, pelvic pain.   Birth Control:  None  Reproductive Concerns Sexually Active: Not Currently Partners Type: N/A Number of partners in last year: STD Testing:  Vaginal/GU Concerns: She denies issues with with urination, constipation, or diarrhea.  Breast Concerns/Exams: She denies breast concerns and reports she checks her breast "every now and then."  She endorses breast awareness. She denies a family history of breast, uterine, cervical, or ovarian cancer  Medical and Nutrition PCP: Charlotte Dickerson, Oct 2022 Significant PMx: HTN, DMT2 Exercise: "Not a lot." Some walking, but not a lot.  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   Past Medical History:  Diagnosis Date   Allergies    Arthritis    Diabetes (HCC)    HTN (hypertension)     Past Surgical History:  Procedure Laterality Date   ROTATOR CUFF REPAIR  06/2020    The following portions of the patient's history were reviewed and updated as appropriate: allergies, current medications, past family history, past medical history, past social history, past surgical history and problem list.   Health Maintenance:  Normal pap and negative HRHPV on Dec 2021 in [place].  Normal mammogram on Dec 2021 in [place].   Review of Systems:  Pertinent items noted in HPI and remainder of comprehensive ROS otherwise negative.    Objective:    Physical Exam BP (!) 147/76 (BP Location: Right Arm, Patient Position: Sitting, Cuff Size: Normal)   Pulse 76   Temp 98 F (36.7 C) (Oral)   Ht 5\' 5"  (1.651 m)   Wt 155 lb 12.8 oz (70.7 kg)   BMI 25.93 kg/m  Physical Exam Vitals reviewed. Exam conducted with a chaperone present.  Constitutional:       Appearance: Normal appearance.  HENT:     Head: Normocephalic and atraumatic.  Eyes:     Conjunctiva/sclera: Conjunctivae normal.  Cardiovascular:     Rate and Rhythm: Normal rate and regular rhythm.     Heart sounds: Normal heart sounds.  Pulmonary:     Effort: Pulmonary effort is normal. No respiratory distress.     Breath sounds: Normal breath sounds.  Chest:  Breasts:    Right: Normal. No mass, nipple discharge or tenderness.     Left: Normal. No mass, nipple discharge or tenderness.     Comments: CBE completed and normal Abdominal:     General: Bowel sounds are normal.     Palpations: Abdomen is soft.     Tenderness: There is no abdominal tenderness.  Genitourinary:    Labia:        Right: No tenderness or lesion.        Left: No tenderness or lesion.      Vagina: Vaginal discharge (Small amt thin white) present. No tenderness or bleeding.     Uterus: Not enlarged and not tender.      Comments: Vaginal vault: Atrophic, white, some red patches.  Pap collected with brush and spatula.  Cervix friable with scant bleeding after collection Musculoskeletal:        General: Normal range of motion.     Cervical back: Normal range of motion.  Skin:    General: Skin is warm and dry.  Neurological:     Mental Status: She is alert and oriented  to person, place, and time.  Psychiatric:        Mood and Affect: Mood normal.        Behavior: Behavior normal.        Thought Content: Thought content normal.     Labs and Imaging No results found for this or any previous visit (from the past 168 hour(s)). No results found.   Assessment & Plan:       60 year old Annual Exam Pap Smear CBE  1. Encounter for well woman exam with routine gynecological exam -Exam performed and findings discussed. -Educated on ASCCP guidelines regarding pap smear evaluation and frequency. -Informed of turnover time and provider/clinic policy on releasing results. -Encouraged to activate Mychart and  utilize for communication with office, reviewing results, and scheduling appts. -Educated on AHA exercise recommendations of 30 minutes of moderate to vigorous activity at least 5x/week. -Educated and encouraged to initiate regular SBE with increased breast awareness including examination of breast for skin changes, moles, tenderness, etc.  -Patient to follow up with PCP regarding colonoscopy screening. - Cytology - PAP( [facility])  2. Encounter for Papanicolaou smear of cervix - Pap collected as previous records unavailable.  - Cytology - PAP( [facility])  3. Encounter for screening breast examination -CBE normal -Will order screening mammogram    Routine preventative health maintenance measures emphasized. Please refer to After Visit Summary for other counseling recommendations.   No follow-ups on file.      Cherre Robins, CNM 11/03/2021

## 2021-11-07 LAB — CYTOLOGY - PAP
Comment: NEGATIVE
Diagnosis: NEGATIVE
High risk HPV: NEGATIVE

## 2021-11-14 ENCOUNTER — Encounter: Payer: Self-pay | Admitting: Registered Nurse

## 2021-11-21 NOTE — Telephone Encounter (Signed)
Interested in [employer]

## 2021-12-19 ENCOUNTER — Telehealth: Payer: Self-pay | Admitting: Registered Nurse

## 2021-12-19 NOTE — Telephone Encounter (Signed)
Pt would  like to transfer to [facility] with Dr Ruthine Dose from [place] office. Pt states it is a little closer to her home.   Pt would like to keep her scheduled appt with Janeece Agee. Please Advise

## 2021-12-20 NOTE — Telephone Encounter (Signed)
Ok by me. Will see her this week and manage care until she is able to get in to est with you.  Thank you  Rich

## 2021-12-22 ENCOUNTER — Other Ambulatory Visit: Payer: Self-pay

## 2021-12-22 ENCOUNTER — Ambulatory Visit (INDEPENDENT_AMBULATORY_CARE_PROVIDER_SITE_OTHER): Payer: 59 | Admitting: Registered Nurse

## 2021-12-22 ENCOUNTER — Encounter: Payer: Self-pay | Admitting: Registered Nurse

## 2021-12-22 VITALS — BP 138/71 | HR 71 | Temp 98.2°F | Resp 16 | Ht 65.0 in | Wt 155.8 lb

## 2021-12-22 DIAGNOSIS — R931 Abnormal findings on diagnostic imaging of heart and coronary circulation: Secondary | ICD-10-CM | POA: Diagnosis not present

## 2021-12-22 DIAGNOSIS — E782 Mixed hyperlipidemia: Secondary | ICD-10-CM

## 2021-12-22 DIAGNOSIS — Z130 Encounter for screening for diseases of the blood and blood-forming organs and certain disorders involving the immune mechanism: Secondary | ICD-10-CM

## 2021-12-22 DIAGNOSIS — Z79899 Other long term (current) drug therapy: Secondary | ICD-10-CM | POA: Diagnosis not present

## 2021-12-22 DIAGNOSIS — E119 Type 2 diabetes mellitus without complications: Secondary | ICD-10-CM | POA: Diagnosis not present

## 2021-12-22 DIAGNOSIS — R519 Headache, unspecified: Secondary | ICD-10-CM

## 2021-12-22 DIAGNOSIS — E118 Type 2 diabetes mellitus with unspecified complications: Secondary | ICD-10-CM

## 2021-12-22 LAB — COMPREHENSIVE METABOLIC PANEL
ALT: 19 U/L (ref 0–35)
AST: 18 U/L (ref 0–37)
Albumin: 4.8 g/dL (ref 3.5–5.2)
Alkaline Phosphatase: 86 U/L (ref 39–117)
BUN: 15 mg/dL (ref 6–23)
CO2: 27 mEq/L (ref 19–32)
Calcium: 10.1 mg/dL (ref 8.4–10.5)
Chloride: 106 mEq/L (ref 96–112)
Creatinine, Ser: 0.79 mg/dL (ref 0.40–1.20)
GFR: 81.86 mL/min (ref >60.00)
Glucose, Bld: 124 mg/dL — ABNORMAL HIGH (ref 70–99)
Potassium: 4.1 mEq/L (ref 3.5–5.1)
Sodium: 141 mEq/L (ref 135–145)
Total Bilirubin: 0.9 mg/dL (ref 0.2–1.2)
Total Protein: 7.3 g/dL (ref 6.0–8.3)

## 2021-12-22 LAB — POCT GLYCOSYLATED HEMOGLOBIN (HGB A1C): Hemoglobin A1C: 6.8 % — AB (ref 4.0–5.6)

## 2021-12-22 LAB — CBC
HCT: 39.1 % (ref 36.0–46.0)
Hemoglobin: 12.7 g/dL (ref 12.0–15.0)
MCHC: 32.4 g/dL (ref 30.0–36.0)
MCV: 79.6 fl (ref 78.0–100.0)
Platelets: 266 10*3/uL (ref 150.0–400.0)
RBC: 4.91 Mil/uL (ref 3.87–5.11)
RDW: 14.5 % (ref 11.5–15.5)
WBC: 5.9 10*3/uL (ref 4.0–10.5)

## 2021-12-22 LAB — MAGNESIUM: Magnesium: 1.7 mg/dL (ref 1.5–2.5)

## 2021-12-22 MED ORDER — FLUTICASONE PROPIONATE 50 MCG/ACT NA SUSP: 2.0000 | Freq: Every day | NASAL | 6 refills | Status: DC

## 2021-12-22 MED ORDER — METFORMIN HCL 1000 MG PO TABS: ORAL_TABLET | ORAL | 1 refills | Status: DC

## 2021-12-22 MED ORDER — ATORVASTATIN CALCIUM 40 MG PO TABS: 40.0000 mg | ORAL_TABLET | Freq: Every day | ORAL | 1 refills | Status: AC

## 2021-12-22 MED ORDER — GLIPIZIDE 10 MG PO TABS: ORAL_TABLET | ORAL | 1 refills | Status: DC

## 2021-12-22 NOTE — Progress Notes (Signed)
Established Patient Office Visit  Subjective:  Patient ID: Charlotte Dickerson, female    DOB: 1961-07-19  Age: 61 y.o. MRN: [id number]  CC:  Chief Complaint  Patient presents with   Follow-up    Follow up on diabetes. Patient states she is noticing a headache, and feeling like she is shocking people all the time or getting shocked    HPI Charlotte Dickerson presents for t2dm  Last A1c:  Lab Results  Component Value Date   HGBA1C 8.0 (A) 09/22/2021    Currently taking: glipizide 65m po qd ac, metformin 10060mpo bid ac - has cut down on metformin No new complications Reports good compliance with medications Diet has been steady Exercise habits have been steady. Home sugars have been 140-180  Headaches On and off, head felt like it was sore Frontal and sinuses. Notes more dairy in diet lately.  Across forehead.  Some sensitivity in scalp, particularly on R side.  Notes no skin changes.  No other URI symptoms Notes cut back on dairy and symptoms have improved.   Past Medical History:  Diagnosis Date   Allergies    Arthritis    Diabetes ([facility]   HTN (hypertension)     Past Surgical History:  Procedure Laterality Date   ROTATOR CUFF REPAIR  06/2020    Family History  Problem Relation Age of Onset   Diabetes Mother    Heart attack Mother    Hypertension Mother    Stroke Mother    Depression Sister     Social History   Socioeconomic History   Marital status: Single    Spouse name: Not on file   Number of children: 2   Years of education: Not on file   Highest education level: Not on file  Occupational History   Not on file  Tobacco Use   Smoking status: Former    Types: Cigarettes    Passive exposure: Never   Smokeless tobacco: Never  Vaping Use   Vaping Use: Never used  Substance and Sexual Activity   Alcohol use: Not Currently    Alcohol/week: 3.0 - 4.0 standard drinks    Types: 3 - 4 Glasses of wine per week   Drug use: Never   Sexual activity: Not  Currently  Other Topics Concern   Not on file  Social History Narrative   Not on file   Social Determinants of Health   Financial Resource Strain: Not on file  Food Insecurity: Not on file  Transportation Needs: Not on file  Physical Activity: Not on file  Stress: Not on file  Social Connections: Not on file  Intimate Partner Violence: Not on file    Outpatient Medications Prior to Visit  Medication Sig Dispense Refill   atorvastatin (LIPITOR) 40 MG tablet Take 1 tablet (40 mg total) by mouth at bedtime. 90 tablet 0   glipiZIDE (GLUCOTROL) 10 MG tablet TAKE 1 TABLET(10 MG) BY MOUTH DAILY BEFORE BREAKFAST 90 tablet 0   glucose blood (TRUE METRIX PRO BLOOD GLUCOSE) test strip Use as instructed 1- 3 times daily to monitor blood glucose. 100 each 12   metFORMIN (GLUCOPHAGE) 1000 MG tablet TAKE 1 TABLET(1000 MG) BY MOUTH TWICE DAILY WITH A MEAL 180 tablet 0   amLODipine (NORVASC) 10 MG tablet TAKE 1 TABLET(10 MG) BY MOUTH DAILY (Patient not taking: Reported on 12/22/2021) 90 tablet 1   clindamycin-benzoyl peroxide (BENZACLIN) gel Apply topically 2 (two) times daily. (Patient not taking: Reported on 11/03/2021) 25 g  0   No facility-administered medications prior to visit.    No Known Allergies  ROS Review of Systems    Objective:    Physical Exam  Temp 98.2 F (36.8 C) (Temporal)    Resp 16    Ht '5\' 5"'  (1.651 m)    Wt 155 lb 12.8 oz (70.7 kg)    BMI 25.93 kg/m  Wt Readings from Last 3 Encounters:  12/22/21 155 lb 12.8 oz (70.7 kg)  11/03/21 155 lb 12.8 oz (70.7 kg)  09/22/21 154 lb 3.2 oz (69.9 kg)     Health Maintenance Due  Topic Date Due   URINE MICROALBUMIN  Never done   HIV Screening  Never done   Hepatitis C Screening  Never done   TETANUS/TDAP  Never done   Zoster Vaccines- Shingrix (1 of 2) Never done   COLONOSCOPY (Pts 45-42yr Insurance coverage will need to be confirmed)  Never done   MAMMOGRAM  Never done   COVID-19 Vaccine (2 - Moderna risk series)  02/08/2020   INFLUENZA VACCINE  Never done    There are no preventive care reminders to display for this patient.  Lab Results  Component Value Date   TSH 0.53 09/22/2021   Lab Results  Component Value Date   WBC 6.3 09/22/2021   HGB 12.5 09/22/2021   HCT 38.7 09/22/2021   MCV 79.6 09/22/2021   PLT 313.0 09/22/2021   Lab Results  Component Value Date   NA 141 09/22/2021   K 4.9 09/22/2021   CO2 28 09/22/2021   GLUCOSE 182 (H) 09/22/2021   BUN 13 09/22/2021   CREATININE 0.76 09/22/2021   BILITOT 1.3 (H) 09/22/2021   ALKPHOS 90 09/22/2021   AST 18 09/22/2021   ALT 20 09/22/2021   PROT 7.3 09/22/2021   ALBUMIN 4.8 09/22/2021   CALCIUM 10.2 09/22/2021   EGFR 60 07/10/2021   GFR 85.90 09/22/2021   Lab Results  Component Value Date   CHOL 135 09/22/2021   Lab Results  Component Value Date   HDL 37.40 (L) 09/22/2021   Lab Results  Component Value Date   LDLCALC 75 09/22/2021   Lab Results  Component Value Date   TRIG 112.0 09/22/2021   Lab Results  Component Value Date   CHOLHDL 4 09/22/2021   Lab Results  Component Value Date   HGBA1C 8.0 (A) 09/22/2021      Assessment & Plan:   Problem List Items Addressed This Visit   None Visit Diagnoses     Type 2 diabetes mellitus without complication, without long-term current use of insulin (HCC)    -  Primary   Relevant Medications   atorvastatin (LIPITOR) 40 MG tablet   glipiZIDE (GLUCOTROL) 10 MG tablet   metFORMIN (GLUCOPHAGE) 1000 MG tablet   Other Relevant Orders   POCT glycosylated hemoglobin (Hb A1C) (Completed)   Magnesium   Comprehensive metabolic panel   Screening, iron deficiency anemia       Relevant Orders   Magnesium   CBC   Iron, TIBC and Ferritin Panel   Medication management       Relevant Medications   atorvastatin (LIPITOR) 40 MG tablet   Abnormal findings diagnostic imaging of heart and coronary circulation       Relevant Medications   atorvastatin (LIPITOR) 40 MG tablet    Mixed hyperlipidemia       Relevant Medications   atorvastatin (LIPITOR) 40 MG tablet   Type 2 diabetes mellitus with complication, without long-term  current use of insulin (HCC)       Relevant Medications   atorvastatin (LIPITOR) 40 MG tablet   glipiZIDE (GLUCOTROL) 10 MG tablet   metFORMIN (GLUCOPHAGE) 1000 MG tablet   Other Relevant Orders   Ambulatory referral to Ophthalmology   Frontal headache       Relevant Medications   fluticasone (FLONASE) 50 MCG/ACT nasal spray       Meds ordered this encounter  Medications   atorvastatin (LIPITOR) 40 MG tablet    Sig: Take 1 tablet (40 mg total) by mouth at bedtime.    Dispense:  90 tablet    Refill:  1    Dose increase    Order Specific Question:   Supervising Provider    Answer:   Carlota Raspberry, JEFFREY R [[id number]]   glipiZIDE (GLUCOTROL) 10 MG tablet    Sig: TAKE 1 TABLET(10 MG) BY MOUTH DAILY BEFORE BREAKFAST    Dispense:  90 tablet    Refill:  1    Order Specific Question:   Supervising Provider    Answer:   Carlota Raspberry, JEFFREY R [[id number]]   metFORMIN (GLUCOPHAGE) 1000 MG tablet    Sig: TAKE 1 TABLET(1000 MG) BY MOUTH TWICE DAILY WITH A MEAL    Dispense:  180 tablet    Refill:  1    Order Specific Question:   Supervising Provider    Answer:   Carlota Raspberry, JEFFREY R [[id number]]   fluticasone (FLONASE) 50 MCG/ACT nasal spray    Sig: Place 2 sprays into both nostrils daily.    Dispense:  16 g    Refill:  6    Order Specific Question:   Supervising Provider    Answer:   Carlota Raspberry, JEFFREY R [[id number]]    Follow-up: Return in about 6 months (around 06/21/2022) for t2dm, hld, htn.   PLAN A1c 6.8. t2dm controlled. Continue current regimen. Recheck labs in regards to headache, pt concerns. Follow up as warranted Flonase sent for frontal headache. Suspect sinus pressure leading to these. No red flags.  Refer to ophthalmology for t2dm eye exam Patient encouraged to call clinic with any questions, comments, or concerns.  Maximiano Coss, NP

## 2021-12-22 NOTE — Patient Instructions (Addendum)
Ms. Charlotte Dickerson to see you  Call if you have concerns  GREAT WORK on the sugars! 6.8!!!!  See you in 6 months, sooner if you need anything  Thank you  Rich     If you have lab work done today you will be contacted with your lab results within the next 2 weeks.  If you have not heard from [place] then please contact us. The fastest way to get your results is to register for My Chart.   IF you received an x-ray today, you will receive an invoice from [facility]. Please contact [facility] at [phone] with questions or concerns regarding your invoice.   IF you received labwork today, you will receive an invoice from [place]. Please contact [facility] at [phone] with questions or concerns regarding your invoice.   Our billing staff will not be able to assist you with questions regarding bills from these companies.  You will be contacted with the lab results as soon as they are available. The fastest way to get your results is to activate your My Chart account. Instructions are located on the last page of this paperwork. If you have not heard from [place] regarding the results in 2 weeks, please contact this office.

## 2021-12-23 LAB — IRON,TIBC AND FERRITIN PANEL
%SAT: 16 % (calc) (ref 16–45)
Ferritin: 69 ng/mL (ref 16–232)
Iron: 57 ug/dL (ref 45–160)
TIBC: 367 mcg/dL (calc) (ref 250–450)

## 2022-02-09 ENCOUNTER — Encounter: Payer: Self-pay | Admitting: Family Medicine

## 2022-02-09 ENCOUNTER — Ambulatory Visit (INDEPENDENT_AMBULATORY_CARE_PROVIDER_SITE_OTHER): Payer: 59 | Admitting: Family Medicine

## 2022-02-09 VITALS — BP 138/76 | HR 69 | Temp 98.1°F | Ht 65.0 in | Wt 156.6 lb

## 2022-02-09 DIAGNOSIS — E782 Mixed hyperlipidemia: Secondary | ICD-10-CM

## 2022-02-09 DIAGNOSIS — I498 Other specified cardiac arrhythmias: Secondary | ICD-10-CM

## 2022-02-09 DIAGNOSIS — I10 Essential (primary) hypertension: Secondary | ICD-10-CM

## 2022-02-09 DIAGNOSIS — E119 Type 2 diabetes mellitus without complications: Secondary | ICD-10-CM

## 2022-02-09 NOTE — Patient Instructions (Signed)
Welcome to [facility]! It was a pleasure meeting you today. ? ?As discussed, Please schedule a 2-3 month follow up visit today. ? ?Check insurance if Shingrix and Tdap immunizations are covered ? ?PLEASE NOTE: ? ?If you had any LAB tests please let us know if you have not heard back within a few days. You may see your results on MyChart before we have a chance to review them but we will give you a call once they are reviewed by [place]. If we ordered any REFERRALS today, please let us know if you have not heard from their office within the next week.  ?Let us know through MyChart if you are needing REFILLS, or have your pharmacy send [place] the request. You can also use MyChart to communicate with me or any office staff. ? ?Please try these tips to maintain a healthy lifestyle: ? ?Eat most of your calories during the day when you are active. Eliminate processed foods including packaged sweets (pies, cakes, cookies), reduce intake of potatoes, white bread, white pasta, and white rice. Look for whole grain options, oat flour or almond flour. ? ?Each meal should contain half fruits/vegetables, one quarter protein, and one quarter carbs (no bigger than a computer mouse). ? ?Cut down on sweet beverages. This includes juice, soda, and sweet tea. Also watch fruit intake, though this is a healthier sweet option, it still contains natural sugar! Limit to 3 servings daily. ? ?Drink at least 1 glass of water with each meal and aim for at least 8 glasses per day ? ?Exercise at least 150 minutes every week.   ?

## 2022-02-09 NOTE — Progress Notes (Signed)
? ?Subjective:  ? ? ? Patient ID: Charlotte Dickerson, female    DOB: 05-02-1961, 61 y.o.   MRN: [id number] ?TOC ? ? ?HPI ?TOC ? Type 2 DM-A1C down to 6.8(from8) inJanuary.  Taking glipizide in am and metformin once daily.  Working some on TLC.  Eating twice daily-intermitt fasting. Taking B12.  Not exercising regularly ?HTN-not on meds-rash around lips.  No meds.  140/80 this am.  Usu 120-130/80 ?HLD-mixed(low HDL).  On atorvastatin.  CTA shows some CAD ? ?Health Maintenance Due  ?Topic Date Due  ? FOOT EXAM  Never done  ? OPHTHALMOLOGY EXAM  Never done  ? URINE MICROALBUMIN  Never done  ? HIV Screening  Never done  ? Hepatitis C Screening  Never done  ? TETANUS/TDAP  Never done  ? Zoster Vaccines- Shingrix (1 of 2) Never done  ? COLONOSCOPY (Pts 45-42yrs Insurance coverage will need to be confirmed)  Never done  ? MAMMOGRAM  Never done  ? COVID-19 Vaccine (2 - Moderna risk series) 02/08/2020  ? INFLUENZA VACCINE  Never done  ? ? ?Past Medical History:  ?Diagnosis Date  ? Allergies   ? Arthritis   ? Diabetes (HCC)   ? HTN (hypertension)   ? ? ?Past Surgical History:  ?Procedure Laterality Date  ? ROTATOR CUFF REPAIR Right 06/2020  ? ? ?Outpatient Medications Prior to Visit  ?Medication Sig Dispense Refill  ? atorvastatin (LIPITOR) 40 MG tablet Take 1 tablet (40 mg total) by mouth at bedtime. 90 tablet 1  ? fluticasone (FLONASE) 50 MCG/ACT nasal spray Place 2 sprays into both nostrils daily. 16 g 6  ? glipiZIDE (GLUCOTROL) 10 MG tablet TAKE 1 TABLET(10 MG) BY MOUTH DAILY BEFORE BREAKFAST 90 tablet 1  ? glucose blood (TRUE METRIX PRO BLOOD GLUCOSE) test strip Use as instructed 1- 3 times daily to monitor blood glucose. 100 each 12  ? metFORMIN (GLUCOPHAGE) 1000 MG tablet TAKE 1 TABLET(1000 MG) BY MOUTH TWICE DAILY WITH A MEAL 180 tablet 1  ? amLODipine (NORVASC) 10 MG tablet TAKE 1 TABLET(10 MG) BY MOUTH DAILY (Patient not taking: Reported on 12/22/2021) 90 tablet 1  ? clindamycin-benzoyl peroxide (BENZACLIN) gel Apply  topically 2 (two) times daily. (Patient not taking: Reported on 02/09/2022) 25 g 0  ? ?No facility-administered medications prior to visit.  ? ? ?No Known Allergies ?ROS neg/noncontributory except as noted HPI/below ?ROS: ?Gen: no fever, chills  ?Skin: no rash, itching ?ENT: no ear pain, ear drainage, nasal congestion, rhinorrhea, sinus pressure, sore throat.  Will see opt soon. ?Eyes: no blurry vision, double vision ?Resp: no cough, wheeze, occ sob at rest. ?Breast: no breast tenderness, no nipple discharge, no breast masses ?CV: no CP, LE edema,  occ palp-has seen Card-caffeine 2-3/d. ?GI: no heartburn, n/v/d/c, abd pain.  Cscope 4-6 yrs ago-in [place] normal per pt ?GU: no dysuria, urgency, frequency, hematuria ?MSK: no joint pain, myalgias, back pain- ?Neuro: no weakness, vertigo.  Occ HA/dizziness.  Some static electricity shocks. ?Psych: no depression, anxiety, insomnia, SI  ? ? ?   ?Objective:  ?  ? ?BP (!) 151/84   Pulse 69   Temp 98.1 ?F (36.7 ?C)   Ht 5\' 5"  (1.651 m)   Wt 156 lb 9.6 oz (71 kg)   SpO2 98%   BMI 26.06 kg/m?  ?Wt Readings from Last 3 Encounters:  ?02/09/22 156 lb 9.6 oz (71 kg)  ?12/22/21 155 lb 12.8 oz (70.7 kg)  ?11/03/21 155 lb 12.8 oz (70.7 kg)  ? ? ?  Physical Exam  ? ?Gen: WDWN NAD.  AAF ?HEENT: NCAT, conjunctiva not injected, sclera nonicteric ?NECK:  supple, no thyromegaly, no nodes, no carotid bruits ?CARDIAC: RRR, S1S2+, no murmur. DP 2+B ?LUNGS: CTAB. No wheezes ?ABDOMEN:  BS+, soft, NTND, No HSM, no masses ?EXT:  no edema ?MSK: no gross abnormalities.  ?NEURO: A&O x3.  CN II-XII intact.  ?PSYCH: normal mood. Good eye contact ? ?   ?Assessment & Plan:  ? ?Problem List Items Addressed This Visit   ? ?  ? Endocrine  ? Type 2 diabetes mellitus without complication, without long-term current use of insulin (HCC) - Primary  ?  ? Other  ? Mixed hyperlipidemia  ? ?Other Visit Diagnoses   ? ? Essential hypertension      ? Fluttering heart      ? ?  ? Type 2 DM-better controlled.  Cont meds,  TLC.  F/u 2-3 mo ?HTN-off meds.  Has been ok.  Cont to monitor.  Work on [employer] ?HLD-controlled on meds.  Cont ?Palpitations-electrolytes, decrease caffeine.  Declines meds ?Pt will check w/ins if immunizations covered ? ?No orders of the defined types were placed in this encounter. ? ? ?Angelena Sole, MD ? ?

## 2022-03-28 ENCOUNTER — Other Ambulatory Visit: Payer: Self-pay | Admitting: Registered Nurse

## 2022-03-28 DIAGNOSIS — I10 Essential (primary) hypertension: Secondary | ICD-10-CM

## 2022-03-29 ENCOUNTER — Other Ambulatory Visit: Payer: Self-pay | Admitting: Interventional Cardiology

## 2022-04-06 LAB — HM DIABETES EYE EXAM

## 2022-04-27 ENCOUNTER — Encounter: Payer: Self-pay | Admitting: Family Medicine

## 2022-05-07 ENCOUNTER — Encounter: Payer: Self-pay | Admitting: Family Medicine

## 2022-05-07 ENCOUNTER — Ambulatory Visit (INDEPENDENT_AMBULATORY_CARE_PROVIDER_SITE_OTHER): Payer: 59 | Admitting: Family Medicine

## 2022-05-07 VITALS — BP 130/70 | HR 69 | Temp 98.2°F | Ht 65.0 in | Wt 155.5 lb

## 2022-05-07 DIAGNOSIS — K219 Gastro-esophageal reflux disease without esophagitis: Secondary | ICD-10-CM | POA: Diagnosis not present

## 2022-05-07 DIAGNOSIS — R0789 Other chest pain: Secondary | ICD-10-CM | POA: Diagnosis not present

## 2022-05-07 DIAGNOSIS — E119 Type 2 diabetes mellitus without complications: Secondary | ICD-10-CM

## 2022-05-07 DIAGNOSIS — R002 Palpitations: Secondary | ICD-10-CM

## 2022-05-07 DIAGNOSIS — E782 Mixed hyperlipidemia: Secondary | ICD-10-CM

## 2022-05-07 DIAGNOSIS — M25511 Pain in right shoulder: Secondary | ICD-10-CM | POA: Diagnosis not present

## 2022-05-07 DIAGNOSIS — G8929 Other chronic pain: Secondary | ICD-10-CM

## 2022-05-07 DIAGNOSIS — M25512 Pain in left shoulder: Secondary | ICD-10-CM

## 2022-05-07 LAB — CBC WITH DIFFERENTIAL/PLATELET
Basophils Absolute: 0 10*3/uL (ref 0.0–0.1)
Basophils Relative: 0.8 % (ref 0.0–3.0)
Eosinophils Absolute: 0.2 10*3/uL (ref 0.0–0.7)
Eosinophils Relative: 3.2 % (ref 0.0–5.0)
HCT: 38.3 % (ref 36.0–46.0)
Hemoglobin: 12.4 g/dL (ref 12.0–15.0)
Lymphocytes Relative: 36.2 % (ref 12.0–46.0)
Lymphs Abs: 2.2 10*3/uL (ref 0.7–4.0)
MCHC: 32.5 g/dL (ref 30.0–36.0)
MCV: 81.1 fl (ref 78.0–100.0)
Monocytes Absolute: 0.4 10*3/uL (ref 0.1–1.0)
Monocytes Relative: 7 % (ref 3.0–12.0)
Neutro Abs: 3.2 10*3/uL (ref 1.4–7.7)
Neutrophils Relative %: 52.8 % (ref 43.0–77.0)
Platelets: 283 10*3/uL (ref 150.0–400.0)
RBC: 4.72 Mil/uL (ref 3.87–5.11)
RDW: 14.2 % (ref 11.5–15.5)
WBC: 6 10*3/uL (ref 4.0–10.5)

## 2022-05-07 LAB — LIPID PANEL
Cholesterol: 189 mg/dL (ref 0–200)
HDL: 43.9 mg/dL (ref >39.00)
LDL Cholesterol: 123 mg/dL — ABNORMAL HIGH (ref 0–99)
NonHDL: 145.24
Total CHOL/HDL Ratio: 4
Triglycerides: 109 mg/dL (ref 0.0–149.0)
VLDL: 21.8 mg/dL (ref 0.0–40.0)

## 2022-05-07 LAB — COMPREHENSIVE METABOLIC PANEL
ALT: 16 U/L (ref 0–35)
AST: 14 U/L (ref 0–37)
Albumin: 4.5 g/dL (ref 3.5–5.2)
Alkaline Phosphatase: 82 U/L (ref 39–117)
BUN: 15 mg/dL (ref 6–23)
CO2: 27 mEq/L (ref 19–32)
Calcium: 9.8 mg/dL (ref 8.4–10.5)
Chloride: 104 mEq/L (ref 96–112)
Creatinine, Ser: 0.8 mg/dL (ref 0.40–1.20)
GFR: 80.42 mL/min (ref >60.00)
Glucose, Bld: 196 mg/dL — ABNORMAL HIGH (ref 70–99)
Potassium: 4.1 mEq/L (ref 3.5–5.1)
Sodium: 139 mEq/L (ref 135–145)
Total Bilirubin: 0.9 mg/dL (ref 0.2–1.2)
Total Protein: 6.9 g/dL (ref 6.0–8.3)

## 2022-05-07 LAB — AMYLASE: Amylase: 57 U/L (ref 27–131)

## 2022-05-07 LAB — VITAMIN B12: Vitamin B-12: 958 pg/mL — ABNORMAL HIGH (ref 211–911)

## 2022-05-07 LAB — H. PYLORI ANTIBODY, IGG: H Pylori IgG: POSITIVE — AB

## 2022-05-07 LAB — LIPASE: Lipase: 41 U/L (ref 11.0–59.0)

## 2022-05-07 LAB — HEMOGLOBIN A1C: Hgb A1c MFr Bld: 7.5 % — ABNORMAL HIGH (ref 4.6–6.5)

## 2022-05-07 MED ORDER — OMEPRAZOLE 40 MG PO CPDR: 40.0000 mg | DELAYED_RELEASE_CAPSULE | Freq: Every day | ORAL | 1 refills | Status: DC

## 2022-05-07 NOTE — Progress Notes (Signed)
Subjective:     Patient ID: Charlotte Dickerson, female    DOB: 02/03/1961, 61 y.o.   MRN: [id number]  Chief Complaint  Patient presents with   Follow-up    2-3 month follow-up on dm and blood work Fasting     HPI  DM type 2-on glipizide 10mg  in am and metformin 1000mg  in am.  Occ in pm.  Still working on intermittant fasting.  Does some exercise.  Bp's 130/70 HLD-doing well on atorvastatin 40mg   Health Maintenance Due  Topic Date Due   FOOT EXAM  Never done   URINE MICROALBUMIN  Never done   HIV Screening  Never done   Hepatitis C Screening  Never done   COLONOSCOPY (Pts 45-10yrs Insurance coverage will need to be confirmed)  Never done   MAMMOGRAM  Never done    Past Medical History:  Diagnosis Date   Allergies    Arthritis    Diabetes (HCC)    HTN (hypertension)     Past Surgical History:  Procedure Laterality Date   ROTATOR CUFF REPAIR Right 06/2020    Outpatient Medications Prior to Visit  Medication Sig Dispense Refill   atorvastatin (LIPITOR) 40 MG tablet Take 1 tablet (40 mg total) by mouth at bedtime. 90 tablet 1   fluticasone (FLONASE) 50 MCG/ACT nasal spray Place 2 sprays into both nostrils daily. 16 g 6   glipiZIDE (GLUCOTROL) 10 MG tablet TAKE 1 TABLET(10 MG) BY MOUTH DAILY BEFORE BREAKFAST 90 tablet 1   glucose blood (TRUE METRIX PRO BLOOD GLUCOSE) test strip Use as instructed 1- 3 times daily to monitor blood glucose. 100 each 12   metFORMIN (GLUCOPHAGE) 1000 MG tablet TAKE 1 TABLET(1000 MG) BY MOUTH TWICE DAILY WITH A MEAL 180 tablet 1   No facility-administered medications prior to visit.    No Known Allergies ROS ROS: Gen: no fever, chills  Skin: no rash, itching ENT: no ear pain, ear drainage, nasal congestion, rhinorrhea, sinus pressure, sore throat Eyes: no blurry vision, double vision Resp: no cough, wheeze, CV: occ pain under L breast to back.  About every other day. Can be sob at times w/it.  Can occur after eats.   When eats get  "palpitations" when eats and can rad across chest. Going on long time, but getting more. Last pm, after ate pistaccios. Lasts about 1 min.  Can be sob, but no n/v/dizziness.  Last pm, "acidy and bubbley" GI: at times, uncomfortable mid epi area and feels like "sponge sucking it in".  No v/d GU: no dysuria, urgency, frequency, hematuria MSK: pain L shoulder and arm and N/T at times. Feels weak at times.  No acute injury but lifts pts, etc. Hard to lie on L side. Some SOB d/t pain when lying on l side. Neuro: no dizziness, headache, weakness, vertigo Psych: no depression, anxiety, insomnia, SI     Objective:     BP 130/70   Pulse 69   Temp 98.2 F (36.8 C) (Temporal)   Ht 5\' 5"  (1.651 m)   Wt 155 lb 8 oz (70.5 kg)   SpO2 96%   BMI 25.88 kg/m  Wt Readings from Last 3 Encounters:  05/07/22 155 lb 8 oz (70.5 kg)  02/09/22 156 lb 9.6 oz (71 kg)  12/22/21 155 lb 12.8 oz (70.7 kg)    Physical Exam   Gen: WDWN NAD AAF HEENT: NCAT, conjunctiva not injected, sclera nonicteric NECK:  supple, no thyromegaly, no nodes, no carotid bruits CARDIAC: RRR, S1S2+, no  murmur. DP 2+B.  No TTP chest wall LUNGS: CTAB. No wheezes ABDOMEN:  BS+, soft, mildly tender mid epi, No HSM, no masses EXT:  no edema MSK: some dec rom R shoulder.  L shoulder-good rom but pain on abduction/rotation.  Good strength.  Some tightness posterior muscles from neck to mid shoulder blade on L  NEURO: A&O x3.  CN II-XII intact.  PSYCH: normal mood. Good eye contact  EKG: NSR rate 72.  No st/t changes.      Assessment & Plan:   Problem List Items Addressed This Visit       Endocrine   Type 2 diabetes mellitus without complication, without long-term current use of insulin (HCC) - Primary   Relevant Orders   Comprehensive metabolic panel   Hemoglobin A1c   Microalbumin / creatinine urine ratio   CBC with Differential/Platelet   Vitamin B12     Other   Mixed hyperlipidemia   Relevant Orders   Lipid panel    Other Visit Diagnoses     Gastroesophageal reflux disease without esophagitis       Relevant Medications   omeprazole (PRILOSEC) 40 MG capsule   Other Relevant Orders   Amylase   H. pylori antibody, IgG   Lipase   Chronic pain of both shoulders       Relevant Orders   Ambulatory referral to Physical Therapy   Other chest pain       Relevant Orders   EKG 12-Lead (Completed)   Palpitations          DM type 2-chronic.  Controlled.  Cont glipizide 10mg  and metformin 1000mg .  Work on and inc exercise.  Check A1C, microalb/creat, cmp,B12 Mixed HLD-chronic controlled on atorvastatin 40mg .  Cont.  Check lipids Pain B shoulders-L is newer.  To ortho-pt to sch and sch PT.   GERD-check cbc,cmp,amylase/lipase/h pylori.  Omeprazole 40mg  daily for 3 mo.  F/u 1 mo.  May be contributing to palp, cp, etc.  Atypical CP but w/mult risk factors-DM,HLD,some CAD on CT.  EKG ok.  May be GERD, but may be heart.  Has sees Card in past-advised to f/u Card Palpitations-seems after eats freq-will tx GERD and ssee if improves.  F/u Card-pt to sch  Return in about 4 weeks (around 06/04/2022) for dm, pain.   Meds ordered this encounter  Medications   omeprazole (PRILOSEC) 40 MG capsule    Sig: Take 1 capsule (40 mg total) by mouth daily.    Dispense:  90 capsule    Refill:  1    [employer], MD

## 2022-05-07 NOTE — Patient Instructions (Addendum)
Call and schedule with Cardiology  Omeprazole for 3 months for stomach, but if not getting better in 3 wks, let me know  See Dr. Roda Shutters about shoulder.  Will refer to PT

## 2022-05-08 ENCOUNTER — Telehealth: Payer: Self-pay | Admitting: *Deleted

## 2022-05-08 ENCOUNTER — Telehealth: Payer: Self-pay | Admitting: Interventional Cardiology

## 2022-05-08 NOTE — Telephone Encounter (Signed)
Left message to call office

## 2022-05-08 NOTE — Telephone Encounter (Signed)
I spoke with patient and reviewed lipid results with her.  She has not been taking Atorvastatin on a daily basis.  She will start taking daily.  Patient is due for follow up with Dr Irish Lack in August or September.  Appointment made for July 27, 2022.  Patient will come in fasting so follow up lab work can be checked at this appointment.

## 2022-05-08 NOTE — Telephone Encounter (Signed)
Pt sent this via Mychart message to the scheduling pool:    The palpitations has been more than 69yrs, but seems to be happening more often.  Sometimes light headed.   Good morning. I don't have history of Afib. The palpitations happen mostly after eating.  Sometimes pain under my left brest, and it  pushes to my back. .our of the time my BP is between 130-140 /80-90     Good morning Charlotte Dickerson,  Can you tell me a little more about your Palpitations?     How long have you had palpitations/irregular HR/ Afib? Are you having the symptoms now?   Are you currently experiencing lightheadedness, SOB or CP?   Do you have a history of afib (atrial fibrillation) or irregular heart rhythm?   Have you checked your BP or HR? (document readings if available):   Are you experiencing any other symptoms?      Appointment Request From: Charlotte Dickerson  With Provider: Larae Grooms, MD [place]  Preferred Date Range: Any  Preferred Times: Monday Morning, Friday Morning  Reason for visit: Office Visit  Comments: Palpitations in my chest, and my breathing

## 2022-05-08 NOTE — Telephone Encounter (Signed)
-----   Message from Corky Crafts, MD sent at 05/07/2022  5:37 PM EDT ----- LDL above target now.  Is she taking her atorvastatin?  If so, would change to rosuvastatin 40 mg daily with check of liver and lipids in 3 months.

## 2022-05-08 NOTE — Telephone Encounter (Signed)
I spoke with patient who reports palpitations and occasional pain under left breast going to her back.  These have been going on for awhile. Palpitations usually occur after eating.  Pain can happen at random times.  Not related to activity. Appointment made for patient to see Christen Bame, NP on May 30, 2022 at 8:00.

## 2022-05-10 ENCOUNTER — Other Ambulatory Visit: Payer: Self-pay | Admitting: *Deleted

## 2022-05-10 MED ORDER — CLARITHROMYCIN 500 MG PO TABS: 500.0000 mg | ORAL_TABLET | Freq: Two times a day (BID) | ORAL | 0 refills | Status: AC

## 2022-05-10 MED ORDER — AMOXICILLIN 500 MG PO CAPS: 1000.0000 mg | ORAL_CAPSULE | Freq: Two times a day (BID) | ORAL | 0 refills | Status: AC

## 2022-05-14 ENCOUNTER — Other Ambulatory Visit: Payer: 59

## 2022-05-14 DIAGNOSIS — E119 Type 2 diabetes mellitus without complications: Secondary | ICD-10-CM

## 2022-05-14 LAB — MICROALBUMIN / CREATININE URINE RATIO
Creatinine,U: 177.3 mg/dL
Microalb Creat Ratio: 1.1 mg/g (ref 0.0–30.0)
Microalb, Ur: 1.9 mg/dL (ref 0.0–1.9)

## 2022-05-15 ENCOUNTER — Encounter: Payer: Self-pay | Admitting: Registered Nurse

## 2022-05-18 ENCOUNTER — Encounter: Payer: Self-pay | Admitting: Orthopaedic Surgery

## 2022-05-18 ENCOUNTER — Ambulatory Visit (INDEPENDENT_AMBULATORY_CARE_PROVIDER_SITE_OTHER): Payer: 59

## 2022-05-18 ENCOUNTER — Ambulatory Visit: Payer: 59 | Admitting: Orthopaedic Surgery

## 2022-05-18 DIAGNOSIS — M25511 Pain in right shoulder: Secondary | ICD-10-CM

## 2022-05-18 DIAGNOSIS — M25512 Pain in left shoulder: Secondary | ICD-10-CM

## 2022-05-18 DIAGNOSIS — G8929 Other chronic pain: Secondary | ICD-10-CM

## 2022-05-18 MED ORDER — BUPIVACAINE HCL 0.5 % IJ SOLN
3.0000 mL | INTRAMUSCULAR | Status: AC | PRN
  Administered 2022-05-18: 3 mL via INTRA_ARTICULAR

## 2022-05-18 MED ORDER — METHYLPREDNISOLONE ACETATE 40 MG/ML IJ SUSP
40.0000 mg | INTRAMUSCULAR | Status: AC | PRN
  Administered 2022-05-18: 40 mg via INTRA_ARTICULAR

## 2022-05-18 MED ORDER — LIDOCAINE HCL 1 % IJ SOLN
3.0000 mL | INTRAMUSCULAR | Status: AC | PRN
  Administered 2022-05-18: 3 mL

## 2022-05-25 ENCOUNTER — Ambulatory Visit: Payer: 59 | Admitting: Physical Therapy

## 2022-05-27 NOTE — Progress Notes (Deleted)
Cardiology Office Note:    Date:  05/27/2022   ID:  Charlotte Dickerson, DOB February 09, 1961, MRN [id number]  PCP:  Jeani Sow, MD   [facility] Providers Cardiologist:  Lance Muss, MD { Click to update primary MD,subspecialty MD or APP then REFRESH:1}    Referring MD: Jeani Sow, MD   Chief Complaint: ***  History of Present Illness:    Charlotte Dickerson is a *** 61 y.o. female with a hx of aortic atherosclerosis, elevated calcium score, type 2 diabetes, and palpitations.   She was referred to our group by PCP for evaluation of palpitations and chest pain and seen by Dr. Eldridge Dace on 05/22/21.  She reported left-sided chest pain and back pain with palpitations lasting 1 to 2 minutes occurring 2-3 times per week.  No family history of early CAD. Coronary CT ordered and completed 06/2021. It revealed calcium score 177, 95th percentile for age/sex matched control, CAD (50-69%)  in mid LCx not hemodynamically significant by FFR analysis, calcified plaque in distal LM, pLAD and pLCX (0-24%).  Cardiac monitor revealed SR with rare PACs and PVCs, no atrial fibrillation, no pathologic arrhythmias.  She was advised to start atorvastatin with follow-up fasting labs in 3 months.  Her recent lipid level revealed LDL 123.  She reported she had not been taking atorvastatin on a daily basis.  She reported that she would start taking it daily and a follow-up appointment was made for September 1. She contacted our office on 05/08/2022 with concerns about palpitations.    Today, she is here    Past Medical History:  Diagnosis Date   Allergies    Arthritis    Diabetes (HCC)    HTN (hypertension)     Past Surgical History:  Procedure Laterality Date   ROTATOR CUFF REPAIR Right 06/2020    Current Medications: No outpatient medications have been marked as taking for the 05/30/22 encounter (Appointment) with Lissa Hoard, Zachary George, NP.     Allergies:   Patient has no known allergies.   Social History    Socioeconomic History   Marital status: Single    Spouse name: Not on file   Number of children: 2   Years of education: Not on file   Highest education level: Not on file  Occupational History   Not on file  Tobacco Use   Smoking status: Former    Types: Cigarettes    Quit date: 1980    Years since quitting: 43.5    Passive exposure: Never   Smokeless tobacco: Never  Vaping Use   Vaping Use: Never used  Substance and Sexual Activity   Alcohol use: Not Currently    Alcohol/week: 3.0 - 4.0 standard drinks of alcohol    Types: 3 - 4 Glasses of wine per week   Drug use: Never   Sexual activity: Not Currently  Other Topics Concern   Not on file  Social History Narrative   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  ROS:   Please see the history of present illness.    *** All other  systems reviewed and are negative.  Labs/Other Studies Reviewed:    The following studies were reviewed today:  CCTA 07/17/21  1. Coronary calcium score of 177. This was 95th percentile for age and sex matched control.   2. Normal coronary origin with codominance.   3. Calcified plaque in the mid LCX causes moderate (50-69%) stenosis. Will send for CTFFR   4. Calcified plaque in the distal left main, proximal LAD, and proximal LCX causes minimal (0-24%) stenosis  1. CT FFR 0.86 across lesion in mid LCX, suggesting lesion is not hemodynamically significant   Cardiac Monitor 06/15/21  Normal sinus rhythm with rare PACs and PVCs. No atrial fibrillation. No pathologic  arrhythmias.  Recent Labs: 09/22/2021: TSH 0.53 12/22/2021: Magnesium 1.7 05/07/2022: ALT 16; BUN 15; Creatinine, Ser 0.80; Hemoglobin 12.4; Platelets 283.0; Potassium 4.1; Sodium 139  Recent Lipid Panel    Component Value Date/Time   CHOL 189 05/07/2022 0855   TRIG 109.0 05/07/2022 0855   HDL 43.90 05/07/2022 0855   CHOLHDL 4 05/07/2022 0855   VLDL 21.8 05/07/2022 0855   LDLCALC 123 (H) 05/07/2022 0855   LDLDIRECT 109.0 03/20/2021 1611     Risk Assessment/Calculations:   {Does this patient have ATRIAL FIBRILLATION?:[phone]}       Physical Exam:    VS:  There were no vitals taken for this visit.    Wt Readings from Last 3 Encounters:  05/07/22 155 lb 8 oz (70.5 kg)  02/09/22 156 lb 9.6 oz (71 kg)  12/22/21 155 lb 12.8 oz (70.7 kg)     GEN: *** Well nourished, well developed in no acute distress HEENT: Normal NECK: No JVD; No carotid bruits CARDIAC: ***RRR, no murmurs, rubs, gallops RESPIRATORY:  Clear to auscultation without rales, wheezing or rhonchi  ABDOMEN: Soft, non-tender, non-distended MUSCULOSKELETAL:  No edema; No deformity. *** pedal pulses, ***bilaterally SKIN: Warm and dry NEUROLOGIC:  Alert and oriented x 3 PSYCHIATRIC:  Normal affect   EKG:  EKG is *** ordered today.  The ekg ordered today demonstrates ***  Diagnoses:    No diagnosis found. Assessment and Plan:     Palpitations: CAD  Hyperlipidemia LDL goal < 70:   {Are you ordering a CV Procedure (e.g. stress test, cath, DCCV, TEE, etc)?   Press F2        :[id number]}    Medication Adjustments/Labs and Tests Ordered: Current medicines are reviewed at length with the patient today.  Concerns regarding medicines are outlined above.  No orders of the defined types were placed in this encounter.  No orders of the defined types were placed in this encounter.   There are no Patient Instructions on file for this visit.   Signed, Levi Aland, NP  05/27/2022 12:28 PM    [facility]

## 2022-05-28 ENCOUNTER — Ambulatory Visit (INDEPENDENT_AMBULATORY_CARE_PROVIDER_SITE_OTHER): Payer: 59 | Admitting: Family Medicine

## 2022-05-28 ENCOUNTER — Encounter: Payer: Self-pay | Admitting: Family Medicine

## 2022-05-28 VITALS — BP 130/70 | HR 74 | Temp 97.5°F | Ht 65.0 in | Wt 153.0 lb

## 2022-05-28 DIAGNOSIS — R002 Palpitations: Secondary | ICD-10-CM

## 2022-05-28 DIAGNOSIS — E119 Type 2 diabetes mellitus without complications: Secondary | ICD-10-CM | POA: Diagnosis not present

## 2022-05-28 DIAGNOSIS — A048 Other specified bacterial intestinal infections: Secondary | ICD-10-CM

## 2022-05-28 MED ORDER — CLARITHROMYCIN 500 MG PO TABS: 500.0000 mg | ORAL_TABLET | Freq: Two times a day (BID) | ORAL | 0 refills | Status: DC

## 2022-05-28 MED ORDER — AMOXICILLIN 500 MG PO CAPS: 1000.0000 mg | ORAL_CAPSULE | Freq: Two times a day (BID) | ORAL | 0 refills | Status: DC

## 2022-05-28 NOTE — Progress Notes (Signed)
Subjective:     Patient ID: Charlotte Dickerson, female    DOB: 09-04-1961, 61 y.o.   MRN: [id number]  Chief Complaint  Patient presents with   Follow-up    4 week follow-up for dm and pain Fasting     HPI  DM-doing better on diet-feeling better. Metformin bid. Am 140.    Went to 80 yest.  Has been 50's after breakfast-2-3 hrs later.  GERD-prob H pylori- doing better on omeprazole.  Didn't get message to get the abx.  3.  Still getting some palpitations occ.  Occurs after eats esp if salty/carb foods. Seeing Card.  Health Maintenance Due  Topic Date Due   FOOT EXAM  Never done   HIV Screening  Never done   Hepatitis C Screening  Never done   COLONOSCOPY (Pts 45-76yrs Insurance coverage will need to be confirmed)  Never done   MAMMOGRAM  Never done    Past Medical History:  Diagnosis Date   Allergies    Arthritis    Diabetes (HCC)    HTN (hypertension)     Past Surgical History:  Procedure Laterality Date   ROTATOR CUFF REPAIR Right 06/2020    Outpatient Medications Prior to Visit  Medication Sig Dispense Refill   atorvastatin (LIPITOR) 40 MG tablet Take 1 tablet (40 mg total) by mouth at bedtime. 90 tablet 1   fluticasone (FLONASE) 50 MCG/ACT nasal spray Place 2 sprays into both nostrils daily. 16 g 6   glipiZIDE (GLUCOTROL) 10 MG tablet TAKE 1 TABLET(10 MG) BY MOUTH DAILY BEFORE BREAKFAST 90 tablet 1   glucose blood (TRUE METRIX PRO BLOOD GLUCOSE) test strip Use as instructed 1- 3 times daily to monitor blood glucose. 100 each 12   metFORMIN (GLUCOPHAGE) 1000 MG tablet TAKE 1 TABLET(1000 MG) BY MOUTH TWICE DAILY WITH A MEAL 180 tablet 1   omeprazole (PRILOSEC) 40 MG capsule Take 1 capsule (40 mg total) by mouth daily. 90 capsule 1   No facility-administered medications prior to visit.    No Known Allergies ROS neg/noncontributory except as noted HPI/below      Objective:     BP 130/70   Pulse 74   Temp (!) 97.5 F (36.4 C) (Temporal)   Ht 5\' 5"  (1.651 m)   Wt  153 lb (69.4 kg)   SpO2 96%   BMI 25.46 kg/m  Wt Readings from Last 3 Encounters:  05/28/22 153 lb (69.4 kg)  05/07/22 155 lb 8 oz (70.5 kg)  02/09/22 156 lb 9.6 oz (71 kg)    Physical Exam   Gen: WDWN NAD HEENT: NCAT, conjunctiva not injected, sclera nonicteric NECK:  supple, no thyromegaly, no nodes, no carotid bruits CARDIAC: RRR, S1S2+, no murmur.  EXT:  no edema MSK: no gross abnormalities.  NEURO: A&O x3.  CN II-XII intact.  PSYCH: normal mood. Good eye contact     Assessment & Plan:   Problem List Items Addressed This Visit       Endocrine   Type 2 diabetes mellitus without complication, without long-term current use of insulin (HCC) - Primary   Other Visit Diagnoses     H. pylori infection       Relevant Medications   clarithromycin (BIAXIN) 500 MG tablet   Palpitations          DM type 2-chronic.  Uncotrolled.  Pt inc metformin to bid.  Some lows-advised to do 2am and fasting checks.  Monitor closely H pylori-pt didn't get the abx.  Resent.  Palpitations-will see card.  ? From low sugars, h pyori, other.  Check sugars to rule that out F/u 3 mo. Meds ordered this encounter  Medications   amoxicillin (AMOXIL) 500 MG capsule    Sig: Take 2 capsules (1,000 mg total) by mouth 2 (two) times daily.    Dispense:  56 capsule    Refill:  0   clarithromycin (BIAXIN) 500 MG tablet    Sig: Take 1 tablet (500 mg total) by mouth 2 (two) times daily.    Dispense:  28 tablet    Refill:  0    Angelena Sole, MD

## 2022-05-28 NOTE — Patient Instructions (Addendum)
It was very nice to see you today!  Do a 3am sugar and then when wake up-once.  H pylori-take the omeprazole twice/day while on the antibiotics for 14 days Hold atorvastatin-while on antibiotics.    PLEASE NOTE:  If you had any lab tests please let us know if you have not heard back within a few days. You may see your results on MyChart before we have a chance to review them but we will give you a call once they are reviewed by [place]. If we ordered any referrals today, please let us know if you have not heard from their office within the next week.   Please try these tips to maintain a healthy lifestyle:  Eat most of your calories during the day when you are active. Eliminate processed foods including packaged sweets (pies, cakes, cookies), reduce intake of potatoes, white bread, white pasta, and white rice. Look for whole grain options, oat flour or almond flour.  Each meal should contain half fruits/vegetables, one quarter protein, and one quarter carbs (no bigger than a computer mouse).  Cut down on sweet beverages. This includes juice, soda, and sweet tea. Also watch fruit intake, though this is a healthier sweet option, it still contains natural sugar! Limit to 3 servings daily.  Drink at least 1 glass of water with each meal and aim for at least 8 glasses per day  Exercise at least 150 minutes every week.

## 2022-05-30 ENCOUNTER — Ambulatory Visit: Payer: 59 | Admitting: Nurse Practitioner

## 2022-05-30 ENCOUNTER — Other Ambulatory Visit: Payer: Self-pay | Admitting: *Deleted

## 2022-05-30 DIAGNOSIS — E119 Type 2 diabetes mellitus without complications: Secondary | ICD-10-CM

## 2022-05-30 MED ORDER — TRUE METRIX PRO BLOOD GLUCOSE VI STRP: ORAL_STRIP | 12 refills | Status: DC

## 2022-06-04 ENCOUNTER — Ambulatory Visit (HOSPITAL_COMMUNITY)
Admission: RE | Admit: 2022-06-04 | Discharge: 2022-06-04 | Disposition: A | Discharge status: Home / Self Care | Payer: 59 | Source: Ambulatory Visit | Attending: Orthopaedic Surgery | Admitting: Orthopaedic Surgery

## 2022-06-04 DIAGNOSIS — G8929 Other chronic pain: Secondary | ICD-10-CM | POA: Diagnosis present

## 2022-06-04 DIAGNOSIS — M25511 Pain in right shoulder: Secondary | ICD-10-CM | POA: Diagnosis not present

## 2022-06-05 NOTE — Progress Notes (Signed)
Needs appointment

## 2022-06-06 NOTE — Progress Notes (Signed)
Tried to call patient. No answer. LMOM for her to call and schedule follow up to discuss results.

## 2022-06-13 ENCOUNTER — Encounter: Payer: Self-pay | Admitting: Orthopaedic Surgery

## 2022-06-13 ENCOUNTER — Ambulatory Visit: Payer: 59 | Admitting: Orthopaedic Surgery

## 2022-06-13 DIAGNOSIS — M25511 Pain in right shoulder: Secondary | ICD-10-CM

## 2022-06-13 DIAGNOSIS — G8929 Other chronic pain: Secondary | ICD-10-CM | POA: Diagnosis not present

## 2022-06-13 NOTE — Progress Notes (Signed)
Office Visit Note   Patient: Charlotte Dickerson           Date of Birth: 09/20/1961           MRN: [id number] Visit Date: 06/13/2022              Requested by: Jeani Sow, MD [address] [place],  [place] [address] PCP: Jeani Sow, MD   Assessment & Plan: Visit Diagnoses:  1. Chronic right shoulder pain     Plan: Impression is chronic right shoulder pain with underlying adhesive capsulitis and tendinitis.  I have discussed with the patient about proceeding with intra-articular cortisone injection and continuation of physical therapy which she is agreeable to.  She will follow-up with [place] as needed.  Follow-Up Instructions: Return if symptoms worsen or fail to improve.   Orders:  No orders of the defined types were placed in this encounter.  No orders of the defined types were placed in this encounter.     Procedures: No procedures performed   Clinical Data: No additional findings.   Subjective: Chief Complaint  Patient presents with   Right Shoulder - Follow-up    MRI review    HPI patient is a pleasant 61 year old female who comes in today to discuss MRI results of the right shoulder.  She underwent rotator cuff repair in [place] few years ago and developed adhesive capsulitis.  She has been seeing [place] for this over the past year or so.  She is always declined cortisone injection but has been to physical therapy for which she has made progress with range of motion.  She continues to have pain however with certain motions of the shoulder.  Recent MRI of the shoulder shows mild tendinosis of the supraspinatus and infraspinatus.  No other acute findings.     Objective: Vital Signs: There were no vitals taken for this visit.    Ortho Exam right shoulder exam reveals forward flexion to approximately 160 degrees.  Internal rotation to L5.  She is neurovascular intact distally.  Specialty Comments:  No specialty comments available.  Imaging: No new  imaging   PMFS History: Patient Active Problem List   Diagnosis Date Noted   Acute pain of left shoulder 05/18/2022   Chronic right shoulder pain 05/18/2022   Type 2 diabetes mellitus without complication, without long-term current use of insulin (HCC) 02/09/2022   Mixed hyperlipidemia 02/09/2022   Itching 10/06/2021   Past Medical History:  Diagnosis Date   Allergies    Arthritis    Diabetes (HCC)    HTN (hypertension)     Family History  Problem Relation Age of Onset   Diabetes Mother    Heart attack Mother    Hypertension Mother    Stroke Mother    Depression Sister     Past Surgical History:  Procedure Laterality Date   ROTATOR CUFF REPAIR Right 06/2020   Social History   Occupational History   Not on file  Tobacco Use   Smoking status: Former    Types: Cigarettes    Quit date: 1980    Years since quitting: 43.5    Passive exposure: Never   Smokeless tobacco: Never  Vaping Use   Vaping Use: Never used  Substance and Sexual Activity   Alcohol use: Not Currently    Alcohol/week: 3.0 - 4.0 standard drinks of alcohol    Types: 3 - 4 Glasses of wine per week   Drug use: Never   Sexual activity:  Not Currently

## 2022-06-29 ENCOUNTER — Ambulatory Visit (HOSPITAL_BASED_OUTPATIENT_CLINIC_OR_DEPARTMENT_OTHER): Payer: 59 | Admitting: Orthopaedic Surgery

## 2022-07-06 ENCOUNTER — Telehealth: Payer: Self-pay | Admitting: Family Medicine

## 2022-07-06 ENCOUNTER — Other Ambulatory Visit: Payer: Self-pay | Admitting: *Deleted

## 2022-07-06 DIAGNOSIS — E119 Type 2 diabetes mellitus without complications: Secondary | ICD-10-CM

## 2022-07-06 MED ORDER — GLIPIZIDE 10 MG PO TABS: ORAL_TABLET | ORAL | 1 refills | Status: DC

## 2022-07-06 NOTE — Telephone Encounter (Signed)
Rx sent to the pharmacy.

## 2022-07-06 NOTE — Telephone Encounter (Signed)
..   Encourage patient to contact the pharmacy for refills or they can request refills through [facility]  LAST APPOINTMENT DATE:  Please schedule appointment if longer than 1 year  05/28/22  NEXT APPOINTMENT DATE: 09/10/22  MEDICATION: glipiZIDE (GLUCOTROL) 10 MG tablet  Is the patient out of medication? Patient states she is going out of the [facility] 07/10/22 and is not sure for long. Patient requests a RX be sent so that she can pick it up by 07/07/22-07/09/22 at the latest  PHARMACY: [id number] [place], [place] - [address] AT [facility] [address] [facility] [address] Phone:  [phone]  Fax:  [phone]      Let patient know to contact pharmacy at the end of the day to make sure medication is ready.  Please notify patient to allow 48-72 hours to process

## 2022-07-26 NOTE — Progress Notes (Deleted)
Cardiology Office Note   Date:  07/26/2022   ID:  Charlotte Dickerson, DOB 1961/04/03, MRN [id number]  PCP:  Jeani Sow, MD    No chief complaint on file.    Wt Readings from Last 3 Encounters:  05/28/22 153 lb (69.4 kg)  05/07/22 155 lb 8 oz (70.5 kg)  02/09/22 156 lb 9.6 oz (71 kg)       History of Present Illness: Charlotte Dickerson is a 61 y.o. female who I first saw in June 2022 for chest pain and palpitations.  No early CAD in the family.   DM for 12 years.  Coronary CTA in August 2022 showed: "Coronary calcium score of 177. This was 95th percentile for age and sex matched control.   2. Normal coronary origin with codominance.   3. Calcified plaque in the mid LCX causes moderate (50-69%) stenosis. Will send for CTFFR   4. Calcified plaque in the distal left main, proximal LAD, and proximal LCX causes minimal (0-24%) stenosis"  All lesions negative by CT FFR.  Zio patch in 2022 showed: "Normal sinus rhythm with rare PACs and PVCs.  No atrial fibrillation.  No pathologic arrhythmias."   Past Medical History:  Diagnosis Date   Allergies    Arthritis    Diabetes (HCC)    HTN (hypertension)     Past Surgical History:  Procedure Laterality Date   ROTATOR CUFF REPAIR Right 06/2020     Current Outpatient Medications  Medication Sig Dispense Refill   amoxicillin (AMOXIL) 500 MG capsule Take 2 capsules (1,000 mg total) by mouth 2 (two) times daily. 56 capsule 0   atorvastatin (LIPITOR) 40 MG tablet Take 1 tablet (40 mg total) by mouth at bedtime. 90 tablet 1   clarithromycin (BIAXIN) 500 MG tablet Take 1 tablet (500 mg total) by mouth 2 (two) times daily. 28 tablet 0   fluticasone (FLONASE) 50 MCG/ACT nasal spray Place 2 sprays into both nostrils daily. 16 g 6   glipiZIDE (GLUCOTROL) 10 MG tablet TAKE 1 TABLET(10 MG) BY MOUTH DAILY BEFORE BREAKFAST 90 tablet 1   glucose blood (TRUE METRIX PRO BLOOD GLUCOSE) test strip Use as instructed 1- 3 times daily to  monitor blood glucose. 100 each 12   metFORMIN (GLUCOPHAGE) 1000 MG tablet TAKE 1 TABLET(1000 MG) BY MOUTH TWICE DAILY WITH A MEAL 180 tablet 1   omeprazole (PRILOSEC) 40 MG capsule Take 1 capsule (40 mg total) by mouth daily. 90 capsule 1   No current facility-administered medications for this visit.    Allergies:   Patient has no known allergies.    Social History:  The patient  reports that she quit smoking about 43 years ago. Her smoking use included cigarettes. She has never been exposed to tobacco smoke. She has never used smokeless tobacco. She reports that she does not currently use alcohol after a past usage of about 3.0 - 4.0 standard drinks of alcohol per week. She reports that she does not use drugs.   Family History:  The patient's ***family history includes Depression in her sister; Diabetes in her mother; Heart attack in her mother; Hypertension in her mother; Stroke in her mother.    ROS:  Please see the history of present illness.   Otherwise, review of systems are positive for ***.   All other systems are reviewed and negative.    PHYSICAL EXAM: VS:  There were no vitals taken for this visit. , BMI There is no height or weight on  file to calculate BMI. GEN: Well nourished, well developed, in no acute distress HEENT: normal Neck: no JVD, carotid bruits, or masses Cardiac: ***RRR; no murmurs, rubs, or gallops,no edema  Respiratory:  clear to auscultation bilaterally, normal work of breathing GI: soft, nontender, nondistended, + BS MS: no deformity or atrophy Skin: warm and dry, no rash Neuro:  Strength and sensation are intact Psych: euthymic mood, full affect   EKG:   The ekg ordered today demonstrates ***   Recent Labs: 09/22/2021: TSH 0.53 12/22/2021: Magnesium 1.7 05/07/2022: ALT 16; BUN 15; Creatinine, Ser 0.80; Hemoglobin 12.4; Platelets 283.0; Potassium 4.1; Sodium 139   Lipid Panel    Component Value Date/Time   CHOL 189 05/07/2022 0855   TRIG 109.0  05/07/2022 0855   HDL 43.90 05/07/2022 0855   CHOLHDL 4 05/07/2022 0855   VLDL 21.8 05/07/2022 0855   LDLCALC 123 (H) 05/07/2022 0855   LDLDIRECT 109.0 03/20/2021 1611     Other studies Reviewed: Additional studies/ records that were reviewed today with results demonstrating: ***.   ASSESSMENT AND PLAN:  CAD:  Diabetes: PAC/PVC:   Current medicines are reviewed at length with the patient today.  The patient concerns regarding her medicines were addressed.  The following changes have been made:  No change***  Labs/ tests ordered today include: *** No orders of the defined types were placed in this encounter.   Recommend 150 minutes/week of aerobic exercise Low fat, low carb, high fiber diet recommended  Disposition:   FU in ***   Signed, Lance Muss, MD  07/26/2022 10:06 AM    [facility] [address] [place], [place], [place]  [address] Phone: [phone]; Fax: [phone]

## 2022-07-27 ENCOUNTER — Ambulatory Visit: Payer: 59 | Admitting: Interventional Cardiology

## 2022-07-27 DIAGNOSIS — I491 Atrial premature depolarization: Secondary | ICD-10-CM

## 2022-07-27 DIAGNOSIS — I493 Ventricular premature depolarization: Secondary | ICD-10-CM

## 2022-07-27 DIAGNOSIS — E782 Mixed hyperlipidemia: Secondary | ICD-10-CM

## 2022-07-27 DIAGNOSIS — E118 Type 2 diabetes mellitus with unspecified complications: Secondary | ICD-10-CM

## 2022-08-20 ENCOUNTER — Encounter: Payer: Self-pay | Admitting: *Deleted

## 2022-09-10 ENCOUNTER — Encounter: Payer: Self-pay | Admitting: Family Medicine

## 2022-09-10 ENCOUNTER — Ambulatory Visit (INDEPENDENT_AMBULATORY_CARE_PROVIDER_SITE_OTHER): Payer: Commercial Managed Care - HMO | Admitting: Family Medicine

## 2022-09-10 ENCOUNTER — Ambulatory Visit: Payer: Self-pay | Admitting: Family Medicine

## 2022-09-10 VITALS — BP 130/70 | HR 83 | Temp 98.0°F | Ht 65.0 in | Wt 156.2 lb

## 2022-09-10 DIAGNOSIS — R519 Headache, unspecified: Secondary | ICD-10-CM

## 2022-09-10 DIAGNOSIS — Z1159 Encounter for screening for other viral diseases: Secondary | ICD-10-CM

## 2022-09-10 DIAGNOSIS — E119 Type 2 diabetes mellitus without complications: Secondary | ICD-10-CM

## 2022-09-10 DIAGNOSIS — E782 Mixed hyperlipidemia: Secondary | ICD-10-CM

## 2022-09-10 LAB — LIPID PANEL
Cholesterol: 266 mg/dL — ABNORMAL HIGH (ref 0–200)
HDL: 39.5 mg/dL (ref >39.00)
Total CHOL/HDL Ratio: 7
Triglycerides: 472 mg/dL — ABNORMAL HIGH (ref 0.0–149.0)

## 2022-09-10 LAB — COMPREHENSIVE METABOLIC PANEL
ALT: 17 U/L (ref 0–35)
AST: 17 U/L (ref 0–37)
Albumin: 4.8 g/dL (ref 3.5–5.2)
Alkaline Phosphatase: 75 U/L (ref 39–117)
BUN: 14 mg/dL (ref 6–23)
CO2: 30 mEq/L (ref 19–32)
Calcium: 10.7 mg/dL — ABNORMAL HIGH (ref 8.4–10.5)
Chloride: 105 mEq/L (ref 96–112)
Creatinine, Ser: 0.85 mg/dL (ref 0.40–1.20)
GFR: 74.6 mL/min (ref >60.00)
Glucose, Bld: 66 mg/dL — ABNORMAL LOW (ref 70–99)
Potassium: 4.4 mEq/L (ref 3.5–5.1)
Sodium: 143 mEq/L (ref 135–145)
Total Bilirubin: 0.8 mg/dL (ref 0.2–1.2)
Total Protein: 7.6 g/dL (ref 6.0–8.3)

## 2022-09-10 LAB — HEMOGLOBIN A1C: Hgb A1c MFr Bld: 7.2 % — ABNORMAL HIGH (ref 4.6–6.5)

## 2022-09-10 LAB — LDL CHOLESTEROL, DIRECT: Direct LDL: 169 mg/dL

## 2022-09-10 LAB — TSH: TSH: 0.68 u[IU]/mL (ref 0.35–5.50)

## 2022-09-10 MED ORDER — GLIPIZIDE 5 MG PO TABS: ORAL_TABLET | ORAL | 1 refills | Status: AC

## 2022-09-10 MED ORDER — FLUTICASONE PROPIONATE 50 MCG/ACT NA SUSP: 2.0000 | Freq: Every day | NASAL | 6 refills | Status: AC

## 2022-09-10 MED ORDER — KETOCONAZOLE 2 % EX SHAM: 1.0000 | MEDICATED_SHAMPOO | CUTANEOUS | 3 refills | Status: AC

## 2022-09-10 MED ORDER — TRUE METRIX PRO BLOOD GLUCOSE VI STRP: ORAL_STRIP | 12 refills | Status: AC

## 2022-09-10 NOTE — Progress Notes (Signed)
Subjective:     Patient ID: Charlotte Dickerson, female    DOB: Sep 28, 1961, 61 y.o.   MRN: [id number]  Chief Complaint  Patient presents with   Follow-up    3 month follow-up for dm     HPI  DM-on metformin 1000bid and glipizide 10mg -sugars running 130-140 in am.  Later in day 70-80.  Some numbness L great toe since childhood(cut foot).   HLD-on atorvastatin 40mg -stopped awhile ago.  Off while on clarithromycin and forgot to restart Colon 5 yrs ago normal per pt.  Done in [place].  Mamm-ins issues  Health Maintenance Due  Topic Date Due   FOOT EXAM  Never done   Hepatitis C Screening  Never done   MAMMOGRAM  Never done    Past Medical History:  Diagnosis Date   Allergies    Arthritis    Diabetes ([place])    HTN (hypertension)     Past Surgical History:  Procedure Laterality Date   ROTATOR CUFF REPAIR Right 06/2020    Outpatient Medications Prior to Visit  Medication Sig Dispense Refill   atorvastatin (LIPITOR) 40 MG tablet Take 1 tablet (40 mg total) by mouth at bedtime. (Patient taking differently: Take 40 mg by mouth at bedtime.) 90 tablet 1   metFORMIN (GLUCOPHAGE) 1000 MG tablet TAKE 1 TABLET(1000 MG) BY MOUTH TWICE DAILY WITH A MEAL 180 tablet 1   fluticasone (FLONASE) 50 MCG/ACT nasal spray Place 2 sprays into both nostrils daily. 16 g 6   glipiZIDE (GLUCOTROL) 10 MG tablet TAKE 1 TABLET(10 MG) BY MOUTH DAILY BEFORE BREAKFAST 90 tablet 1   glucose blood (TRUE METRIX PRO BLOOD GLUCOSE) test strip Use as instructed 1- 3 times daily to monitor blood glucose. 100 each 12   amoxicillin (AMOXIL) 500 MG capsule Take 2 capsules (1,000 mg total) by mouth 2 (two) times daily. (Patient not taking: Reported on 09/10/2022) 56 capsule 0   clarithromycin (BIAXIN) 500 MG tablet Take 1 tablet (500 mg total) by mouth 2 (two) times daily. (Patient not taking: Reported on 09/10/2022) 28 tablet 0   omeprazole (PRILOSEC) 40 MG capsule Take 1 capsule (40 mg total) by mouth daily. (Patient not taking:  Reported on 09/10/2022) 90 capsule 1   No facility-administered medications prior to visit.    No Known Allergies ROS neg/noncontributory except as noted HPI/below Past 1-2 days some doe occ.  Allergies flaring.  Occurs more when resting after being active.  Dry scalp/dandruf long time.        Objective:     BP 130/70   Pulse 83   Temp 98 F (36.7 C) (Temporal)   Ht 5\' 5"  (1.651 m)   Wt 156 lb 4 oz (70.9 kg)   SpO2 96%   BMI 26.00 kg/m  Wt Readings from Last 3 Encounters:  09/10/22 156 lb 4 oz (70.9 kg)  05/28/22 153 lb (69.4 kg)  05/07/22 155 lb 8 oz (70.5 kg)    Physical Exam   Gen: WDWN NAD HEENT: NCAT, conjunctiva not injected, sclera nonicteric NECK:  supple, no thyromegaly, no nodes, no carotid bruits CARDIAC: RRR, S1S2+, no murmur. DP 2+B LUNGS: CTAB. No wheezes ABDOMEN:  BS+, soft, NTND, No HSM, no masses EXT:  no edema MSK: no gross abnormalities.  NEURO: A&O x3.  CN II-XII intact.  PSYCH: normal mood. Good eye contact  Diabetic Foot Exam - Simple   Simple Foot Form Diabetic Foot exam was performed with the following findings: Yes 09/10/2022  1:37 PM  Visual  Inspection See comments: Yes Sensation Testing Intact to touch and monofilament testing bilaterally: Yes Pulse Check Posterior Tibialis and Dorsalis pulse intact bilaterally: Yes Comments Callus R lateral 5th area         Assessment & Plan:   Problem List Items Addressed This Visit       Endocrine   Type 2 diabetes mellitus without complication, without long-term current use of insulin ([place]) - Primary   Relevant Medications   glipiZIDE (GLUCOTROL) 5 MG tablet   glucose blood (TRUE METRIX PRO BLOOD GLUCOSE) test strip   Other Relevant Orders   Comprehensive metabolic panel   Hemoglobin A1c   Lipid panel   TSH     Other   Mixed hyperlipidemia   Relevant Orders   Comprehensive metabolic panel   Lipid panel   TSH   Other Visit Diagnoses     Encounter for hepatitis C screening  test for low risk patient       Relevant Orders   Hepatitis C antibody   Frontal headache       Relevant Medications   fluticasone (FLONASE) 50 MCG/ACT nasal spray      DM type 2-chronic. Uncontrolled(hyperglycemia).  Check cmp,tsh,A1C. Cont metformin 1000mg  bid and decrase glipizide tp 5mg  as too many lows.  Work on diet/exercise HLD-chronic.  Was uncontrolled.  restart lipitor 40mg .  Check lft's and tsh and lipids. HA-more allergies-chronic. Recurring as Fall-refill flonase.   Meds ordered this encounter  Medications   fluticasone (FLONASE) 50 MCG/ACT nasal spray    Sig: Place 2 sprays into both nostrils daily.    Dispense:  16 g    Refill:  6   glipiZIDE (GLUCOTROL) 5 MG tablet    Sig: TAKE 1 TABLET(10 MG) BY MOUTH DAILY BEFORE BREAKFAST    Dispense:  90 tablet    Refill:  1   glucose blood (TRUE METRIX PRO BLOOD GLUCOSE) test strip    Sig: Use as instructed 1- 3 times daily to monitor blood glucose.    Dispense:  100 each    Refill:  12   ketoconazole (NIZORAL) 2 % shampoo    Sig: Apply 1 Application topically 2 (two) times a week.    Dispense:  120 mL    Refill:  3    Wellington Hampshire, MD

## 2022-09-10 NOTE — Patient Instructions (Signed)
It was very nice to see you today!  Do 5mg  glipizide.  Take the metformin 2x/day.  Restart the atorvastatin   PLEASE NOTE:  If you had any lab tests please let us know if you have not heard back within a few days. You may see your results on MyChart before we have a chance to review them but we will give you a call once they are reviewed by [place]. If we ordered any referrals today, please let us know if you have not heard from their office within the next week.   Please try these tips to maintain a healthy lifestyle:  Eat most of your calories during the day when you are active. Eliminate processed foods including packaged sweets (pies, cakes, cookies), reduce intake of potatoes, white bread, white pasta, and white rice. Look for whole grain options, oat flour or almond flour.  Each meal should contain half fruits/vegetables, one quarter protein, and one quarter carbs (no bigger than a computer mouse).  Cut down on sweet beverages. This includes juice, soda, and sweet tea. Also watch fruit intake, though this is a healthier sweet option, it still contains natural sugar! Limit to 3 servings daily.  Drink at least 1 glass of water with each meal and aim for at least 8 glasses per day  Exercise at least 150 minutes every week.

## 2022-09-11 LAB — HEPATITIS C ANTIBODY: Hepatitis C Ab: NONREACTIVE

## 2022-09-13 ENCOUNTER — Ambulatory Visit: Payer: 59 | Admitting: Family Medicine

## 2022-09-13 NOTE — Progress Notes (Signed)
Sugar was low day of labs.  We did decrease the glipizide.  Need to work on diet. A1C was acceptable but definitely, work on diet/exercise Cholesterol too high-make sure getting back on atorvastatin as we discussed.  Also, triglycerides high-need to work on diet and take fish oil 4000mg /day

## 2022-10-03 ENCOUNTER — Other Ambulatory Visit: Payer: Self-pay | Admitting: *Deleted

## 2022-10-03 DIAGNOSIS — E119 Type 2 diabetes mellitus without complications: Secondary | ICD-10-CM

## 2022-10-03 MED ORDER — METFORMIN HCL 1000 MG PO TABS: ORAL_TABLET | ORAL | 1 refills | Status: AC

## 2022-10-15 ENCOUNTER — Telehealth: Payer: Self-pay | Admitting: Family Medicine

## 2022-10-15 NOTE — Telephone Encounter (Signed)
Please advise 

## 2022-10-15 NOTE — Telephone Encounter (Signed)
Patient requests to be called at ph# [phone]  re: Patient wants to know if it is okay for her to take Vitamin B12

## 2022-10-17 NOTE — Telephone Encounter (Signed)
Patient notified and verbalized understanding. No specific reason, was just wondering if she needed it.

## 2022-11-08 ENCOUNTER — Other Ambulatory Visit: Payer: Self-pay | Admitting: Family Medicine

## 2022-11-08 DIAGNOSIS — Z1231 Encounter for screening mammogram for malignant neoplasm of breast: Secondary | ICD-10-CM

## 2022-11-13 ENCOUNTER — Ambulatory Visit (HOSPITAL_BASED_OUTPATIENT_CLINIC_OR_DEPARTMENT_OTHER)
Admission: RE | Admit: 2022-11-13 | Discharge: 2022-11-13 | Disposition: A | Discharge status: Home / Self Care | Payer: Commercial Managed Care - HMO | Source: Ambulatory Visit | Attending: Family Medicine | Admitting: Family Medicine

## 2022-11-13 DIAGNOSIS — Z1231 Encounter for screening mammogram for malignant neoplasm of breast: Secondary | ICD-10-CM | POA: Insufficient documentation

## 2022-12-24 NOTE — Progress Notes (Deleted)
Office Visit    Patient Name: Charlotte Dickerson Date of Encounter: 12/24/2022  Primary Care Provider:  Tawnya Crook, MD Primary Cardiologist:  Larae Grooms, MD Primary Electrophysiologist: None  Chief Complaint    Charlotte Dickerson is a 62 y.o. female with PMH of HTN, diabetes, arthritis, palpitations, precordial pain who presents today for follow-up.  Past Medical History    Past Medical History:  Diagnosis Date   Allergies    Arthritis    Diabetes ([place])    HTN (hypertension)    Past Surgical History:  Procedure Laterality Date   ROTATOR CUFF REPAIR Right 06/2020    Allergies  No Known Allergies  History of Present Illness    Charlotte Dickerson  is a 62 year old female with the above mention past medical history who presents today for routine follow-up of palpitations.  Charlotte Dickerson was initially seen 04/2021 by Dr. Irish Lack for complaint of palpitations and chest pain.  During visit patient reported that palpitations can last 1 to 2 minutes and chest pain extends to lower back.  There is no early CAD in her family and patient was sent for cardiac CT scan that revealed normal coronaries with a calcium score of 177 and moderate calcification in left circumflex (50-69% and 0-24% calcification in left circumflex.  FFR was completed and revealed mild CAD without significant disease.  Her atorvastatin was increased to 40 mg daily and patient was recommended to exercise regularly and follow a healthy diet.  Since last being seen in the office patient reports***.  Patient denies chest pain, palpitations, dyspnea, PND, orthopnea, nausea, vomiting, dizziness, syncope, edema, weight gain, or early satiety.     ***Notes:  Home Medications    Current Outpatient Medications  Medication Sig Dispense Refill   atorvastatin (LIPITOR) 40 MG tablet Take 1 tablet (40 mg total) by mouth at bedtime. (Patient taking differently: Take 40 mg by mouth at bedtime.) 90 tablet 1   fluticasone (FLONASE) 50  MCG/ACT nasal spray Place 2 sprays into both nostrils daily. 16 g 6   glipiZIDE (GLUCOTROL) 5 MG tablet TAKE 1 TABLET(10 MG) BY MOUTH DAILY BEFORE BREAKFAST 90 tablet 1   glucose blood (TRUE METRIX PRO BLOOD GLUCOSE) test strip Use as instructed 1- 3 times daily to monitor blood glucose. 100 each 12   ketoconazole (NIZORAL) 2 % shampoo Apply 1 Application topically 2 (two) times a week. 120 mL 3   metFORMIN (GLUCOPHAGE) 1000 MG tablet TAKE 1 TABLET(1000 MG) BY MOUTH TWICE DAILY WITH A MEAL 180 tablet 1   No current facility-administered medications for this visit.     Review of Systems  Please see the history of present illness.    (+)*** (+)***  All other systems reviewed and are otherwise negative except as noted above.  Physical Exam    Wt Readings from Last 3 Encounters:  09/10/22 156 lb 4 oz (70.9 kg)  05/28/22 153 lb (69.4 kg)  05/07/22 155 lb 8 oz (70.5 kg)   [id number] were no vitals filed for this visit.,There is no height or weight on file to calculate BMI.  Constitutional:      Appearance: Healthy appearance. Not in distress.  Neck:     Vascular: JVD normal.  Pulmonary:     Effort: Pulmonary effort is normal.     Breath sounds: No wheezing. No rales. Diminished in the bases Cardiovascular:     Normal rate. Regular rhythm. Normal S1. Normal S2.      Murmurs: There  is no murmur.  Edema:    Peripheral edema absent.  Abdominal:     Palpations: Abdomen is soft non tender. There is no hepatomegaly.  Skin:    General: Skin is warm and dry.  Neurological:     General: No focal deficit present.     Mental Status: Alert and oriented to person, place and time.     Cranial Nerves: Cranial nerves are intact.  EKG/LABS/Other Studies Reviewed    ECG personally reviewed by me today - ***  Risk Assessment/Calculations:   {Does this patient have ATRIAL FIBRILLATION?:[id number]}        Lab Results  Component Value Date   WBC 6.0 05/07/2022   HGB 12.4 05/07/2022   HCT  38.3 05/07/2022   MCV 81.1 05/07/2022   PLT 283.0 05/07/2022   Lab Results  Component Value Date   CREATININE 0.85 09/10/2022   BUN 14 09/10/2022   NA 143 09/10/2022   K 4.4 09/10/2022   CL 105 09/10/2022   CO2 30 09/10/2022   Lab Results  Component Value Date   ALT 17 09/10/2022   AST 17 09/10/2022   ALKPHOS 75 09/10/2022   BILITOT 0.8 09/10/2022   Lab Results  Component Value Date   CHOL 266 (H) 09/10/2022   HDL 39.50 09/10/2022   LDLCALC 123 (H) 05/07/2022   LDLDIRECT 169.0 09/10/2022   TRIG (H) 09/10/2022    472.0 Triglyceride is over 400; calculations on Lipids are invalid.   CHOLHDL 7 09/10/2022    Lab Results  Component Value Date   HGBA1C 7.2 (H) 09/10/2022    Assessment & Plan    1.  Nonobstructive CAD:  2.  Hyperlipidemia:  3.  Palpitations:  4.  DM type II:      Disposition: Follow-up with Larae Grooms, MD or APP in *** months {Are you ordering a CV Procedure (e.g. stress test, cath, DCCV, TEE, etc)?   Press F2        :[id number]   Medication Adjustments/Labs and Tests Ordered: Current medicines are reviewed at length with the patient today.  Concerns regarding medicines are outlined above.   Signed, Mable Fill, Marissa Nestle, NP 12/24/2022, 4:58 PM [facility]  Note:  This document was prepared using Dragon voice recognition software and may include unintentional dictation errors.

## 2022-12-25 ENCOUNTER — Ambulatory Visit: Payer: Self-pay | Admitting: Nurse Practitioner

## 2022-12-25 DIAGNOSIS — E782 Mixed hyperlipidemia: Secondary | ICD-10-CM

## 2022-12-25 DIAGNOSIS — E118 Type 2 diabetes mellitus with unspecified complications: Secondary | ICD-10-CM

## 2022-12-25 DIAGNOSIS — I2510 Atherosclerotic heart disease of native coronary artery without angina pectoris: Secondary | ICD-10-CM

## 2022-12-25 DIAGNOSIS — R002 Palpitations: Secondary | ICD-10-CM

## 2023-01-02 ENCOUNTER — Ambulatory Visit: Payer: Commercial Managed Care - HMO

## 2023-01-30 ENCOUNTER — Ambulatory Visit: Payer: BLUE CROSS/BLUE SHIELD | Attending: Interventional Cardiology | Admitting: Interventional Cardiology

## 2023-01-30 ENCOUNTER — Encounter: Payer: Self-pay | Admitting: Interventional Cardiology

## 2023-01-30 VITALS — BP 142/76 | HR 75 | Ht 65.0 in | Wt 150.6 lb

## 2023-01-30 DIAGNOSIS — I491 Atrial premature depolarization: Secondary | ICD-10-CM

## 2023-01-30 DIAGNOSIS — I2510 Atherosclerotic heart disease of native coronary artery without angina pectoris: Secondary | ICD-10-CM | POA: Diagnosis not present

## 2023-01-30 DIAGNOSIS — E118 Type 2 diabetes mellitus with unspecified complications: Secondary | ICD-10-CM | POA: Diagnosis not present

## 2023-01-30 DIAGNOSIS — E782 Mixed hyperlipidemia: Secondary | ICD-10-CM | POA: Diagnosis not present

## 2023-01-30 DIAGNOSIS — I2584 Coronary atherosclerosis due to calcified coronary lesion: Secondary | ICD-10-CM

## 2023-01-30 DIAGNOSIS — I493 Ventricular premature depolarization: Secondary | ICD-10-CM

## 2023-01-30 NOTE — Progress Notes (Signed)
Cardiology Office Note   Date:  01/30/2023   ID:  [name], DOB [birth year], MRN [id number]  PCP:  Tawnya Crook, MD    No chief complaint on file.    Wt Readings from Last 3 Encounters:  01/30/23 150 lb 9.6 oz (68.3 kg)  09/10/22 156 lb 4 oz (70.9 kg)  05/28/22 153 lb (69.4 kg)       History of Present Illness: Charlotte Dickerson is a 62 y.o. female   with diabetes who had a prior cardiac workup in June 2022.Marland Kitchen  Coronary CTA in August 2022 showed: "Coronary calcium score of 177. This was 95th percentile for age and sex matched control.   2. Normal coronary origin with codominance.   3. Calcified plaque in the mid LCX causes moderate (50-69%) stenosis. Will send for CTFFR   4. Calcified plaque in the distal left main, proximal LAD, and proximal LCX causes minimal (0-24%) stenosis"  CT FFR showed: "1. Left Main: No significant lesions   2. LAD: No significant lesions   3. LCX: CTFFR 0.86 across lesion in mid LCX, suggesting lesion is not hemodynamically significant   4. RCA: No significant lesions   IMPRESSION: 1. CT FFR 0.86 across lesion in mid LCX, suggesting lesion is not hemodynamically significant"  Monitor in 05/2021: "Normal sinus rhythm with rare PACs and PVCs.  No atrial fibrillation.  No pathologic arrhythmias."    Past Medical History:  Diagnosis Date   Allergies    Arthritis    Diabetes ([place])    HTN (hypertension)     Past Surgical History:  Procedure Laterality Date   ROTATOR CUFF REPAIR Right 06/2020     Current Outpatient Medications  Medication Sig Dispense Refill   atorvastatin (LIPITOR) 40 MG tablet Take 1 tablet (40 mg total) by mouth at bedtime. (Patient taking differently: Take 40 mg by mouth at bedtime.) 90 tablet 1   fluticasone (FLONASE) 50 MCG/ACT nasal spray Place 2 sprays into both nostrils daily. 16 g 6   glipiZIDE (GLUCOTROL) 5 MG tablet TAKE 1 TABLET(10 MG) BY MOUTH DAILY BEFORE BREAKFAST 90 tablet 1   glucose  blood (TRUE METRIX PRO BLOOD GLUCOSE) test strip Use as instructed 1- 3 times daily to monitor blood glucose. 100 each 12   ibuprofen (ADVIL) 800 MG tablet Take 800 mg by mouth every 8 (eight) hours as needed for moderate pain.     ipratropium (ATROVENT) 0.03 % nasal spray Place 1 spray into the nose.     ketoconazole (NIZORAL) 2 % shampoo Apply 1 Application topically 2 (two) times a week. 120 mL 3   metFORMIN (GLUCOPHAGE) 1000 MG tablet TAKE 1 TABLET(1000 MG) BY MOUTH TWICE DAILY WITH A MEAL 180 tablet 1   No current facility-administered medications for this visit.    Allergies:   Patient has no known allergies.    Social History:  The patient  reports that she quit smoking about 44 years ago. Her smoking use included cigarettes. She has never been exposed to tobacco smoke. She has never used smokeless tobacco. She reports that she does not currently use alcohol after a past usage of about 3.0 - 4.0 standard drinks of alcohol per week. She reports that she does not use drugs.   Family History:  The patient's family history includes Depression in her sister; Diabetes in her mother; Heart attack in her mother; Hypertension in her mother; Stroke in her mother.    ROS:  Please see the history of present  illness.   Otherwise, review of systems are positive for intermittent fasting.   All other systems are reviewed and negative.    PHYSICAL EXAM: VS:  BP (!) 142/76   Pulse 75   Ht '5\' 5"'$  (1.651 m)   Wt 150 lb 9.6 oz (68.3 kg)   SpO2 97%   BMI 25.06 kg/m  , BMI Body mass index is 25.06 kg/m. GEN: Well nourished, well developed, in no acute distress HEENT: normal Neck: no JVD, carotid bruits, or masses Cardiac: RRR; no murmurs, rubs, or gallops,no edema  Respiratory:  clear to auscultation bilaterally, normal work of breathing GI: soft, nontender, nondistended, + BS MS: no deformity or atrophy Skin: warm and dry, no rash Neuro:  Strength and sensation are intact Psych: euthymic mood,  full affect   EKG:   The ekg ordered today demonstrates normal ECG   Recent Labs: 05/07/2022: Hemoglobin 12.4; Platelets 283.0 09/10/2022: ALT 17; BUN 14; Creatinine, Ser 0.85; Potassium 4.4; Sodium 143; TSH 0.68   Lipid Panel    Component Value Date/Time   CHOL 266 (H) 09/10/2022 1341   TRIG (H) 09/10/2022 1341    472.0 Triglyceride is over 400; calculations on Lipids are invalid.   HDL 39.50 09/10/2022 1341   CHOLHDL 7 09/10/2022 1341   VLDL 21.8 05/07/2022 0855   LDLCALC 123 (H) 05/07/2022 0855   LDLDIRECT 169.0 09/10/2022 1341     Other studies Reviewed: Additional studies/ records that were reviewed today with results demonstrating: October 2023 cholesterol total cholesterol 266 HDL 39 LDL 123 triglycerides 109.     ASSESSMENT AND PLAN:  Coronary artery calcification/CAD: No angina on medical therapy.  No problems with walking up stairs. Continue aspirin 81 mg dialy.  No bleeding problems.  Diabetes: October 2023 A1c 7.2.  Avoid concentrated sweets.  Does intermittent fasting.  PACs/PVCs: Palpitations improved.   Avoid processed foods.  High fiber diet.   Hyperlipidemia: target LDL is less than 100.  Taking atorvastatin but was not taking it daily.  Will increase frequency to daily.  2/24 LDL 142.   Home BPs typically in the [id number] systolic.     Current medicines are reviewed at length with the patient today.  The patient concerns regarding her medicines were addressed.  The following changes have been made:  No change  Labs/ tests ordered today include:  No orders of the defined types were placed in this encounter.   Recommend 150 minutes/week of aerobic exercise Low fat, low carb, high fiber diet recommended  Disposition:   FU in 1 year   Signed, Larae Grooms, MD  01/30/2023 3:30 PM    [facility] [place], [place], [place]  [address] Phone: [phone]; Fax: [phone]

## 2023-01-30 NOTE — Patient Instructions (Signed)
Medication Instructions:  Your physician recommends that you continue on your current medications as directed. Please refer to the Current Medication list given to you today.  *If you need a refill on your cardiac medications before your next appointment, please call your pharmacy*   Lab Work: none If you have labs (blood work) drawn today and your tests are completely normal, you will receive your results only by: [place] (if you have MyChart) OR A paper copy in the mail If you have any lab test that is abnormal or we need to change your treatment, we will call you to review the results.   Testing/Procedures: none   Follow-Up: At [facility], you and your health needs are our priority.  As part of our continuing mission to provide you with exceptional heart care, we have created designated Provider Care Teams.  These Care Teams include your primary Cardiologist (physician) and Advanced Practice Providers (APPs -  Physician Assistants and Nurse Practitioners) who all work together to provide you with the care you need, when you need it.  We recommend signing up for the patient portal called "MyChart".  Sign up information is provided on this After Visit Summary.  MyChart is used to connect with patients for Virtual Visits (Telemedicine).  Patients are able to view lab/test results, encounter notes, upcoming appointments, etc.  Non-urgent messages can be sent to your provider as well.   To learn more about what you can do with MyChart, go to [email or web address].    Your next appointment:   12 month(s)  Provider:   Larae Grooms, MD     Other Instructions High-Fiber Eating Plan Fiber, also called dietary fiber, is a type of carbohydrate. It is found foods such as fruits, vegetables, whole grains, and beans. A high-fiber diet can have many health benefits. Your health care provider may recommend a high-fiber diet to help: Prevent constipation. Fiber can make  your bowel movements more regular. Lower your cholesterol. Relieve the following conditions: Inflammation of veins in the anus (hemorrhoids). Inflammation of specific areas of the digestive tract (uncomplicated diverticulosis). A problem of the large intestine, also called the colon, that sometimes causes pain and diarrhea (irritable bowel syndrome, or IBS). Prevent overeating as part of a weight-loss plan. Prevent heart disease, type 2 diabetes, and certain cancers. What are tips for following this plan? Reading food labels  Check the nutrition facts label on food products for the amount of dietary fiber. Choose foods that have 5 grams of fiber or more per serving. The goals for recommended daily fiber intake include: Men (age 60 or younger): 34-38 g. Men (over age 40): 28-34 g. Women (age 39 or younger): 25-28 g. Women (over age 19): 22-25 g. Your daily fiber goal is _____________ g. Shopping Choose whole fruits and vegetables instead of processed forms, such as apple juice or applesauce. Choose a wide variety of high-fiber foods such as avocados, lentils, oats, and kidney beans. Read the nutrition facts label of the foods you choose. Be aware of foods with added fiber. These foods often have high sugar and sodium amounts per serving. Cooking Use whole-grain flour for baking and cooking. Cook with brown rice instead of white rice. Meal planning Start the day with a breakfast that is high in fiber, such as a cereal that contains 5 g of fiber or more per serving. Eat breads and cereals that are made with whole-grain flour instead of refined flour or white flour. Eat brown rice, bulgur wheat, or  millet instead of white rice. Use beans in place of meat in soups, salads, and pasta dishes. Be sure that half of the grains you eat each day are whole grains. General information You can get the recommended daily intake of dietary fiber by: Eating a variety of fruits, vegetables, grains,  nuts, and beans. Taking a fiber supplement if you are not able to take in enough fiber in your diet. It is better to get fiber through food than from a supplement. Gradually increase how much fiber you consume. If you increase your intake of dietary fiber too quickly, you may have bloating, cramping, or gas. Drink plenty of water to help you digest fiber. Choose high-fiber snacks, such as berries, raw vegetables, nuts, and popcorn. What foods should I eat? Fruits Berries. Pears. Apples. Oranges. Avocado. Prunes and raisins. Dried figs. Vegetables Sweet potatoes. Spinach. Kale. Artichokes. Cabbage. Broccoli. Cauliflower. Green peas. Carrots. Squash. Grains Whole-grain breads. Multigrain cereal. Oats and oatmeal. Brown rice. Barley. Bulgur wheat. Plato. Quinoa. Bran muffins. Popcorn. Rye wafer crackers. Meats and other proteins Navy beans, kidney beans, and pinto beans. Soybeans. Split peas. Lentils. Nuts and seeds. Dairy Fiber-fortified yogurt. Beverages Fiber-fortified soy milk. Fiber-fortified orange juice. Other foods Fiber bars. The items listed above may not be a complete list of recommended foods and beverages. Contact a dietitian for more information. What foods should I avoid? Fruits Fruit juice. Cooked, strained fruit. Vegetables Fried potatoes. Canned vegetables. Well-cooked vegetables. Grains White bread. Pasta made with refined flour. White rice. Meats and other proteins Fatty cuts of meat. Fried chicken or fried fish. Dairy Milk. Yogurt. Cream cheese. Sour cream. Fats and oils Butters. Beverages Soft drinks. Other foods Cakes and pastries. The items listed above may not be a complete list of foods and beverages to avoid. Talk with your dietitian about what choices are best for you. Summary Fiber is a type of carbohydrate. It is found in foods such as fruits, vegetables, whole grains, and beans. A high-fiber diet has many benefits. It can help to prevent  constipation, lower blood cholesterol, aid weight loss, and reduce your risk of heart disease, diabetes, and certain cancers. Increase your intake of fiber gradually. Increasing fiber too quickly may cause cramping, bloating, and gas. Drink plenty of water while you increase the amount of fiber you consume. The best sources of fiber include whole fruits and vegetables, whole grains, nuts, seeds, and beans. This information is not intended to replace advice given to you by your health care provider. Make sure you discuss any questions you have with your health care provider. Document Revised: 03/17/2020 Document Reviewed: 03/17/2020 Elsevier Patient Education  [place].

## 2023-02-01 NOTE — Addendum Note (Signed)
Addended by: Sharee Holster R on: 02/01/2023 07:41 AM   Modules accepted: Orders

## 2023-03-11 ENCOUNTER — Other Ambulatory Visit: Payer: Self-pay | Admitting: Family Medicine

## 2023-03-11 DIAGNOSIS — E119 Type 2 diabetes mellitus without complications: Secondary | ICD-10-CM

## 2023-04-16 ENCOUNTER — Other Ambulatory Visit: Payer: Self-pay | Admitting: Family Medicine

## 2023-04-16 DIAGNOSIS — E119 Type 2 diabetes mellitus without complications: Secondary | ICD-10-CM
